# Patient Record
Sex: Female | Born: 1975 | Race: Black or African American | Hispanic: No | Marital: Single | Smoking: Former smoker
Health system: Southern US, Community
[De-identification: ages and names within clinical notes are randomized; demographics above are authoritative.]

## PROBLEM LIST (undated history)

## (undated) DIAGNOSIS — I10 Essential (primary) hypertension: Secondary | ICD-10-CM

## (undated) DIAGNOSIS — M1990 Unspecified osteoarthritis, unspecified site: Secondary | ICD-10-CM

## (undated) DIAGNOSIS — T7840XA Allergy, unspecified, initial encounter: Secondary | ICD-10-CM

## (undated) DIAGNOSIS — F32A Depression, unspecified: Secondary | ICD-10-CM

## (undated) DIAGNOSIS — R519 Headache, unspecified: Secondary | ICD-10-CM

## (undated) DIAGNOSIS — K219 Gastro-esophageal reflux disease without esophagitis: Secondary | ICD-10-CM

## (undated) DIAGNOSIS — D649 Anemia, unspecified: Secondary | ICD-10-CM

## (undated) DIAGNOSIS — R87619 Unspecified abnormal cytological findings in specimens from cervix uteri: Secondary | ICD-10-CM

## (undated) DIAGNOSIS — J45909 Unspecified asthma, uncomplicated: Secondary | ICD-10-CM

## (undated) DIAGNOSIS — F419 Anxiety disorder, unspecified: Secondary | ICD-10-CM

## (undated) HISTORY — DX: Headache, unspecified: R51.9

## (undated) HISTORY — DX: Depression, unspecified: F32.A

## (undated) HISTORY — PX: KNEE SURGERY: SHX244

## (undated) HISTORY — DX: Essential (primary) hypertension: I10

## (undated) HISTORY — DX: Unspecified abnormal cytological findings in specimens from cervix uteri: R87.619

## (undated) HISTORY — DX: Allergy, unspecified, initial encounter: T78.40XA

## (undated) HISTORY — DX: Anxiety disorder, unspecified: F41.9

## (undated) HISTORY — DX: Anemia, unspecified: D64.9

## (undated) HISTORY — PX: TUBAL LIGATION: SHX77

## (undated) HISTORY — DX: Unspecified osteoarthritis, unspecified site: M19.90

## (undated) HISTORY — PX: WISDOM TOOTH EXTRACTION: SHX21

---

## 2006-04-02 ENCOUNTER — Emergency Department: Payer: Self-pay | Admitting: Emergency Medicine

## 2006-11-04 ENCOUNTER — Emergency Department: Payer: Self-pay | Admitting: General Practice

## 2007-03-25 ENCOUNTER — Emergency Department: Payer: Self-pay | Admitting: Emergency Medicine

## 2007-06-12 ENCOUNTER — Emergency Department: Payer: Self-pay | Admitting: Emergency Medicine

## 2007-12-10 ENCOUNTER — Emergency Department: Payer: Self-pay | Admitting: Emergency Medicine

## 2007-12-10 ENCOUNTER — Other Ambulatory Visit: Payer: Self-pay

## 2011-02-01 ENCOUNTER — Emergency Department: Payer: Self-pay | Admitting: Internal Medicine

## 2011-03-19 ENCOUNTER — Emergency Department: Payer: Self-pay | Admitting: Emergency Medicine

## 2011-12-19 ENCOUNTER — Emergency Department: Payer: Self-pay | Admitting: Emergency Medicine

## 2011-12-19 LAB — URINALYSIS, COMPLETE
Bacteria: NONE SEEN
Bilirubin,UR: NEGATIVE
Glucose,UR: NEGATIVE mg/dL (ref 0–75)
Ph: 5 (ref 4.5–8.0)
RBC,UR: 9 /HPF (ref 0–5)
Specific Gravity: 1.026 (ref 1.003–1.030)
Squamous Epithelial: 1
WBC UR: 1 /HPF (ref 0–5)

## 2011-12-19 LAB — COMPREHENSIVE METABOLIC PANEL
Bilirubin,Total: 0.5 mg/dL (ref 0.2–1.0)
Calcium, Total: 8.9 mg/dL (ref 8.5–10.1)
Chloride: 109 mmol/L — ABNORMAL HIGH (ref 98–107)
Co2: 22 mmol/L (ref 21–32)
EGFR (African American): >60
EGFR (Non-African Amer.): >60
SGOT(AST): 17 U/L (ref 15–37)
SGPT (ALT): 20 U/L

## 2011-12-19 LAB — CBC
HCT: 36.7 % (ref 35.0–47.0)
MCH: 29.6 pg (ref 26.0–34.0)
MCHC: 32.3 g/dL (ref 32.0–36.0)
MCV: 92 fL (ref 80–100)
Platelet: 234 10*3/uL (ref 150–440)
RDW: 14.1 % (ref 11.5–14.5)

## 2011-12-19 LAB — LIPASE, BLOOD: Lipase: 103 U/L (ref 73–393)

## 2011-12-19 LAB — PREGNANCY, URINE: Pregnancy Test, Urine: NEGATIVE m[IU]/mL

## 2012-11-06 ENCOUNTER — Emergency Department: Payer: Self-pay | Admitting: Emergency Medicine

## 2012-11-06 LAB — COMPREHENSIVE METABOLIC PANEL
Albumin: 3.9 g/dL (ref 3.4–5.0)
Alkaline Phosphatase: 61 U/L (ref 50–136)
BUN: 11 mg/dL (ref 7–18)
Co2: 23 mmol/L (ref 21–32)
Creatinine: 0.69 mg/dL (ref 0.60–1.30)
EGFR (African American): >60
Potassium: 3.4 mmol/L — ABNORMAL LOW (ref 3.5–5.1)
SGPT (ALT): 19 U/L (ref 12–78)
Total Protein: 7.8 g/dL (ref 6.4–8.2)

## 2012-11-06 LAB — LIPASE, BLOOD: Lipase: 117 U/L (ref 73–393)

## 2012-11-06 LAB — URINALYSIS, COMPLETE
Blood: NEGATIVE
Glucose,UR: NEGATIVE mg/dL (ref 0–75)
Leukocyte Esterase: NEGATIVE
Nitrite: NEGATIVE
Ph: 5 (ref 4.5–8.0)
RBC,UR: 9 /HPF (ref 0–5)

## 2012-11-06 LAB — CBC WITH DIFFERENTIAL/PLATELET
Basophil %: 0.4 %
Eosinophil #: 0.2 10*3/uL (ref 0.0–0.7)
HCT: 39.6 % (ref 35.0–47.0)
HGB: 13.1 g/dL (ref 12.0–16.0)
Lymphocyte %: 10 %
MCH: 30.3 pg (ref 26.0–34.0)
Monocyte #: 0.6 x10 3/mm (ref 0.2–0.9)
Platelet: 267 10*3/uL (ref 150–440)
RBC: 4.33 10*6/uL (ref 3.80–5.20)
RDW: 13.8 % (ref 11.5–14.5)
WBC: 6.1 10*3/uL (ref 3.6–11.0)

## 2012-11-06 LAB — WET PREP, GENITAL

## 2012-11-06 LAB — PREGNANCY, URINE: Pregnancy Test, Urine: NEGATIVE m[IU]/mL

## 2012-11-18 ENCOUNTER — Ambulatory Visit: Payer: Self-pay | Admitting: Surgery

## 2012-11-19 ENCOUNTER — Other Ambulatory Visit: Payer: Self-pay | Admitting: Surgery

## 2012-11-19 LAB — COMPREHENSIVE METABOLIC PANEL
Alkaline Phosphatase: 47 U/L — ABNORMAL LOW (ref 50–136)
Bilirubin,Total: 0.3 mg/dL (ref 0.2–1.0)
Chloride: 106 mmol/L (ref 98–107)
Creatinine: 0.99 mg/dL (ref 0.60–1.30)
EGFR (African American): >60
EGFR (Non-African Amer.): >60
Glucose: 90 mg/dL (ref 65–99)
Osmolality: 274 (ref 275–301)
Potassium: 3.3 mmol/L — ABNORMAL LOW (ref 3.5–5.1)
SGOT(AST): 26 U/L (ref 15–37)
Total Protein: 7.3 g/dL (ref 6.4–8.2)

## 2012-11-19 LAB — CBC WITH DIFFERENTIAL/PLATELET
Basophil %: 1.3 %
Eosinophil #: 0 10*3/uL (ref 0.0–0.7)
HGB: 12.3 g/dL (ref 12.0–16.0)
Lymphocyte #: 1.5 10*3/uL (ref 1.0–3.6)
Lymphocyte %: 31.8 %
MCH: 29.8 pg (ref 26.0–34.0)
MCV: 91 fL (ref 80–100)
Monocyte #: 0.9 x10 3/mm (ref 0.2–0.9)
Monocyte %: 19.8 %
Neutrophil #: 2.2 10*3/uL (ref 1.4–6.5)
Neutrophil %: 46.5 %
RBC: 4.12 10*6/uL (ref 3.80–5.20)
RDW: 14.1 % (ref 11.5–14.5)
WBC: 4.8 10*3/uL (ref 3.6–11.0)

## 2012-11-19 LAB — LIPASE, BLOOD: Lipase: 232 U/L (ref 73–393)

## 2012-11-21 ENCOUNTER — Ambulatory Visit: Payer: Self-pay | Admitting: Surgery

## 2013-11-19 ENCOUNTER — Emergency Department: Payer: Self-pay | Admitting: Emergency Medicine

## 2015-03-14 ENCOUNTER — Encounter: Payer: Self-pay | Admitting: Emergency Medicine

## 2015-03-14 ENCOUNTER — Emergency Department
Admission: EM | Admit: 2015-03-14 | Discharge: 2015-03-14 | Disposition: A | Discharge status: Home / Self Care | Payer: Managed Care, Other (non HMO) | Attending: Emergency Medicine | Admitting: Emergency Medicine

## 2015-03-14 ENCOUNTER — Emergency Department: Payer: Managed Care, Other (non HMO)

## 2015-03-14 DIAGNOSIS — S46911A Strain of unspecified muscle, fascia and tendon at shoulder and upper arm level, right arm, initial encounter: Secondary | ICD-10-CM | POA: Diagnosis not present

## 2015-03-14 DIAGNOSIS — Y9289 Other specified places as the place of occurrence of the external cause: Secondary | ICD-10-CM | POA: Insufficient documentation

## 2015-03-14 DIAGNOSIS — Z87891 Personal history of nicotine dependence: Secondary | ICD-10-CM | POA: Insufficient documentation

## 2015-03-14 DIAGNOSIS — Y9389 Activity, other specified: Secondary | ICD-10-CM | POA: Diagnosis not present

## 2015-03-14 DIAGNOSIS — Y998 Other external cause status: Secondary | ICD-10-CM | POA: Diagnosis not present

## 2015-03-14 DIAGNOSIS — X58XXXA Exposure to other specified factors, initial encounter: Secondary | ICD-10-CM | POA: Diagnosis not present

## 2015-03-14 DIAGNOSIS — S4991XA Unspecified injury of right shoulder and upper arm, initial encounter: Secondary | ICD-10-CM | POA: Diagnosis present

## 2015-03-14 MED ORDER — DIAZEPAM 2 MG PO TABS: 2.0000 mg | ORAL_TABLET | Freq: Three times a day (TID) | ORAL | Status: DC | PRN

## 2015-03-14 NOTE — ED Notes (Signed)
Pt discharged home after verbalizing understanding of discharge instructions; nad noted. 

## 2015-03-14 NOTE — ED Notes (Signed)
Resumed care from [place]. Pt alert & oriented and resting.

## 2015-03-14 NOTE — ED Notes (Signed)
Pt reports that she developed right shoulder pain yesterday, she states that she injured in the gym on Saturday. No swelling or deformity seen.

## 2015-03-14 NOTE — Discharge Instructions (Signed)
Take ibuprofen, 6 in her milligrams 3 times a day, as discussed. He may also take Valium 2-4 mg as needed in the evening to help with some muscle relaxation. Use a sling to help rest the shoulder. If you do not improve over the next 4-5 days follow-up with orthopedics. Return to the emergency department if you have any urgent concerns.  Muscle Strain A muscle strain is an injury that occurs when a muscle is stretched beyond its normal length. Usually a small number of muscle fibers are torn when this happens. Muscle strain is rated in degrees. First-degree strains have the least amount of muscle fiber tearing and pain. Second-degree and third-degree strains have increasingly more tearing and pain.  Usually, recovery from muscle strain takes 1-2 weeks. Complete healing takes 5-6 weeks.  CAUSES  Muscle strain happens when a sudden, violent force placed on a muscle stretches it too far. This may occur with lifting, sports, or a fall.  RISK FACTORS Muscle strain is especially common in athletes.  SIGNS AND SYMPTOMS At the site of the muscle strain, there may be:  Pain.  Bruising.  Swelling.  Difficulty using the muscle due to pain or lack of normal function. DIAGNOSIS  Your health care provider will perform a physical exam and ask about your medical history. TREATMENT  Often, the best treatment for a muscle strain is resting, icing, and applying cold compresses to the injured area.  HOME CARE INSTRUCTIONS   Use the PRICE method of treatment to promote muscle healing during the first 2-3 days after your injury. The PRICE method involves:  Protecting the muscle from being injured again.  Restricting your activity and resting the injured body part.  Icing your injury. To do this, put ice in a plastic bag. Place a towel between your skin and the bag. Then, apply the ice and leave it on from 15-20 minutes each hour. After the third day, switch to moist heat packs.  Apply compression to the  injured area with a splint or elastic bandage. Be careful not to wrap it too tightly. This may interfere with blood circulation or increase swelling.  Elevate the injured body part above the level of your heart as often as you can.  Only take over-the-counter or prescription medicines for pain, discomfort, or fever as directed by your health care provider.  Warming up prior to exercise helps to prevent future muscle strains. SEEK MEDICAL CARE IF:   You have increasing pain or swelling in the injured area.  You have numbness, tingling, or a significant loss of strength in the injured area. MAKE SURE YOU:   Understand these instructions.  Will watch your condition.  Will get help right away if you are not doing well or get worse. Document Released: 10/01/2005 Document Revised: 07/22/2013 Document Reviewed: 04/30/2013 [facility] Patient Information 2015 [employer], [place]. This information is not intended to replace advice given to you by your health care provider. Make sure you discuss any questions you have with your health care provider.

## 2015-03-14 NOTE — ED Notes (Signed)
Was lifting weights Saturday, pain with movement R shoulder began Sunday am.

## 2015-03-14 NOTE — ED Provider Notes (Signed)
[facility] Emergency Department Provider Note  ____________________________________________  Time seen: 11:15 AM  I have reviewed the triage vital signs and the nursing notes.   HISTORY  Chief Complaint Shoulder Pain     HPI Cassandra Cardenas is a 39 y.o. female who was working out in the gym on Saturday. She changed upper teens some but it is not completely new to her. Sunday morning she woke with a great feel of discomfort and tightness in her right shoulder. This continues despite ibuprofen and some Tylenol. She has been going to work and trying to do her work related tasks, but this is difficult with the pain and soreness. She denies any acute trauma area and there is no pop or snap or single event that caused pain for her. She does have some mild paresthesias that go down the right arm.    History reviewed. No pertinent past medical history.  There are no active problems to display for this patient.   History reviewed. No pertinent past surgical history.  Current Outpatient Rx  Name  Route  Sig  Dispense  Refill  . diazepam (VALIUM) 2 MG tablet   Oral   Take 1 tablet (2 mg total) by mouth every 8 (eight) hours as needed for anxiety or muscle spasms.   12 tablet   0     Allergies Latex  No family history on file.  Social History History  Substance Use Topics  . Smoking status: Former Games developermoker  . Smokeless tobacco: Not on file  . Alcohol Use: No    Review of Systems  Constitutional: Negative for fever. ENT: Negative for sore throat. Cardiovascular: Negative for chest pain. Respiratory: Negative for shortness of breath. Gastrointestinal: Negative for abdominal pain, vomiting and diarrhea. Genitourinary: Negative for dysuria. Musculoskeletal: Positive for right shoulder pain Skin: Negative for rash. Neurological: Negative for headaches   10-point ROS otherwise  negative.  ____________________________________________   PHYSICAL EXAM:  VITAL SIGNS: ED Triage Vitals  Enc Vitals Group     BP 03/14/15 0957 119/61 mmHg     Pulse Rate 03/14/15 0957 74     Resp 03/14/15 0957 18     Temp 03/14/15 0957 97.7 F (36.5 C)     Temp Source 03/14/15 0957 Oral     SpO2 03/14/15 0957 100 %     Weight 03/14/15 0957 151 lb (68.493 kg)     Height 03/14/15 0957 5\' 5"  (1.651 m)     Head Cir --      Peak Flow --      Pain Score 03/14/15 0957 8     Pain Loc --      Pain Edu? --      Excl. in GC? --     Constitutional:  Alert and oriented. Well appearing and in no distress. Neck: Nontender no muscle spasm. Cardiovascular: Normal rate, regular rhythm. Respiratory: Normal respiratory effort without tachypnea. Breath sounds are clear and equal bilaterally. No wheezes/rales/rhonchi. Gastrointestinal: Soft and nontender. No distention.  Back: Mild tenderness in the right upper back right trapezius. Otherwise negative Musculoskeletal: No instability no pain in the right clavicle. There is some mild point tenderness over the before meals joint of the right shoulder. The deltoid itself is quite tender and slightly firm. Her range of motion is decreased due to pain in the shoulder but she is able to move it through a fairly good amount of range area she has an intact pulse and good motion in her  hand and wrist. Neurologic:  Normal speech and language. No gross focal neurologic deficits are appreciated.  Skin:  Skin is warm, dry. No rash noted. Psychiatric: Mood and affect are normal. Speech and behavior are normal.  ____________________________________________    RADIOLOGY  Right shoulder x-ray: No acute abnormality noted.  ____________________________________________  INITIAL IMPRESSION / ASSESSMENT AND PLAN / ED COURSE  Patient with right shoulder strain. Treat with ibuprofen. I will prescribe Valium 2 mg to assist with some muscle relaxation. I advised her  to take this only in the evening or before bed and not drink any work time activity.  ____________________________________________   FINAL CLINICAL IMPRESSION(S) / ED DIAGNOSES  Final diagnoses:  Right shoulder strain, initial encounter      [name], MD 03/14/15 1134

## 2015-06-20 ENCOUNTER — Emergency Department: Payer: Managed Care, Other (non HMO)

## 2015-06-20 ENCOUNTER — Encounter: Payer: Self-pay | Admitting: Medical Oncology

## 2015-06-20 ENCOUNTER — Emergency Department
Admission: EM | Admit: 2015-06-20 | Discharge: 2015-06-20 | Disposition: A | Discharge status: Home / Self Care | Payer: Managed Care, Other (non HMO) | Attending: Emergency Medicine | Admitting: Emergency Medicine

## 2015-06-20 DIAGNOSIS — R112 Nausea with vomiting, unspecified: Secondary | ICD-10-CM | POA: Diagnosis not present

## 2015-06-20 DIAGNOSIS — Z3202 Encounter for pregnancy test, result negative: Secondary | ICD-10-CM | POA: Insufficient documentation

## 2015-06-20 DIAGNOSIS — Z87891 Personal history of nicotine dependence: Secondary | ICD-10-CM | POA: Insufficient documentation

## 2015-06-20 DIAGNOSIS — R319 Hematuria, unspecified: Secondary | ICD-10-CM

## 2015-06-20 DIAGNOSIS — R109 Unspecified abdominal pain: Secondary | ICD-10-CM | POA: Diagnosis present

## 2015-06-20 DIAGNOSIS — R1084 Generalized abdominal pain: Secondary | ICD-10-CM | POA: Insufficient documentation

## 2015-06-20 DIAGNOSIS — Z91040 Latex allergy status: Secondary | ICD-10-CM | POA: Diagnosis not present

## 2015-06-20 LAB — COMPREHENSIVE METABOLIC PANEL
ALBUMIN: 4.4 g/dL (ref 3.5–5.0)
ALT: 15 U/L (ref 14–54)
ANION GAP: 9 (ref 5–15)
AST: 22 U/L (ref 15–41)
Alkaline Phosphatase: 38 U/L (ref 38–126)
BILIRUBIN TOTAL: 1.1 mg/dL (ref 0.3–1.2)
BUN: 12 mg/dL (ref 6–20)
CO2: 21 mmol/L — ABNORMAL LOW (ref 22–32)
Calcium: 9.1 mg/dL (ref 8.9–10.3)
Chloride: 107 mmol/L (ref 101–111)
Creatinine, Ser: 0.81 mg/dL (ref 0.44–1.00)
GFR calc non Af Amer: >60 mL/min (ref >60)
GLUCOSE: 82 mg/dL (ref 65–99)
POTASSIUM: 3.7 mmol/L (ref 3.5–5.1)
Sodium: 137 mmol/L (ref 135–145)
TOTAL PROTEIN: 7.3 g/dL (ref 6.5–8.1)

## 2015-06-20 LAB — URINALYSIS COMPLETE WITH MICROSCOPIC (ARMC ONLY)
BILIRUBIN URINE: NEGATIVE
Bacteria, UA: NONE SEEN
Glucose, UA: NEGATIVE mg/dL
HGB URINE DIPSTICK: NEGATIVE
LEUKOCYTES UA: NEGATIVE
NITRITE: NEGATIVE
PH: 8 (ref 5.0–8.0)
PROTEIN: NEGATIVE mg/dL
SPECIFIC GRAVITY, URINE: 1.018 (ref 1.005–1.030)

## 2015-06-20 LAB — CHLAMYDIA/NGC RT PCR (ARMC ONLY)
Chlamydia Tr: NOT DETECTED
N gonorrhoeae: NOT DETECTED

## 2015-06-20 LAB — WET PREP, GENITAL
CLUE CELLS WET PREP: NONE SEEN
TRICH WET PREP: NONE SEEN
Yeast Wet Prep HPF POC: NONE SEEN

## 2015-06-20 LAB — CBC
HCT: 38 % (ref 35.0–47.0)
HEMOGLOBIN: 12.7 g/dL (ref 12.0–16.0)
MCH: 30.8 pg (ref 26.0–34.0)
MCHC: 33.3 g/dL (ref 32.0–36.0)
MCV: 92.7 fL (ref 80.0–100.0)
Platelets: 240 10*3/uL (ref 150–440)
RBC: 4.1 MIL/uL (ref 3.80–5.20)
RDW: 12.8 % (ref 11.5–14.5)
WBC: 9.1 10*3/uL (ref 3.6–11.0)

## 2015-06-20 LAB — PREGNANCY, URINE: Preg Test, Ur: NEGATIVE

## 2015-06-20 LAB — LIPASE, BLOOD: LIPASE: 20 U/L — AB (ref 22–51)

## 2015-06-20 MED ORDER — ONDANSETRON HCL 4 MG/2ML IJ SOLN
4.0000 mg | Freq: Once | INTRAMUSCULAR | Status: AC
  Administered 2015-06-20 (×2): 4 mg via INTRAVENOUS

## 2015-06-20 MED ORDER — KETOROLAC TROMETHAMINE 10 MG PO TABS: 10.0000 mg | ORAL_TABLET | Freq: Three times a day (TID) | ORAL | Status: DC | PRN

## 2015-06-20 MED ORDER — IOHEXOL 300 MG/ML  SOLN
100.0000 mL | Freq: Once | INTRAMUSCULAR | Status: AC | PRN
  Administered 2015-06-20: 100 mL via INTRAVENOUS
  Filled 2015-06-20: qty 100

## 2015-06-20 MED ORDER — MORPHINE SULFATE (PF) 4 MG/ML IV SOLN
4.0000 mg | Freq: Once | INTRAVENOUS | Status: AC
  Administered 2015-06-20: 4 mg via INTRAVENOUS
  Filled 2015-06-20: qty 1

## 2015-06-20 MED ORDER — HYDROCODONE-ACETAMINOPHEN 5-325 MG PO TABS: 1.0000 | ORAL_TABLET | Freq: Four times a day (QID) | ORAL | Status: DC | PRN

## 2015-06-20 MED ORDER — IOHEXOL 240 MG/ML SOLN
25.0000 mL | Freq: Once | INTRAMUSCULAR | Status: AC | PRN
  Administered 2015-06-20: 25 mL via ORAL
  Filled 2015-06-20: qty 25

## 2015-06-20 MED ORDER — ONDANSETRON HCL 4 MG/2ML IJ SOLN
4.0000 mg | Freq: Once | INTRAMUSCULAR | Status: AC
  Administered 2015-06-20: 4 mg via INTRAVENOUS
  Filled 2015-06-20: qty 2

## 2015-06-20 MED ORDER — ONDANSETRON HCL 4 MG PO TABS: 4.0000 mg | ORAL_TABLET | Freq: Three times a day (TID) | ORAL | Status: DC | PRN

## 2015-06-20 MED ORDER — ONDANSETRON HCL 4 MG/2ML IJ SOLN
INTRAMUSCULAR | Status: AC
  Administered 2015-06-20: 4 mg via INTRAVENOUS
  Filled 2015-06-20: qty 2

## 2015-06-20 MED ORDER — TAMSULOSIN HCL 0.4 MG PO CAPS: 0.4000 mg | ORAL_CAPSULE | Freq: Every day | ORAL | Status: DC

## 2015-06-20 MED ORDER — SODIUM CHLORIDE 0.9 % IV BOLUS (SEPSIS)
1000.0000 mL | Freq: Once | INTRAVENOUS | Status: AC
  Administered 2015-06-20: 1000 mL via INTRAVENOUS

## 2015-06-20 NOTE — ED Notes (Signed)
Pt to triage with reports that she began having vomiting this am and then she started having left lower abd pain with radiation of pain into rt flank. Denies dysuria.

## 2015-06-20 NOTE — ED Notes (Signed)
POCT is Negative

## 2015-06-20 NOTE — ED Provider Notes (Addendum)
[facility] Emergency Department Provider Note   ____________________________________________  Time seen: 6:30 PM I have reviewed the triage vital signs and the triage nursing note.  HISTORY  Chief Complaint Emesis; Abdominal Pain; and Flank Pain   Historian Patient  HPI Cassandra Cardenas is a 39 y.o. female who wrote this morning feeling nausea and then vomited twice. She then started having left-sided abdominal pain which then moved over into the right side abdomen and into the right flank. No fever. Pain is moderate to severe. No pelvic pain. Does not feel like her prior ovarian cyst. No pelvic pain. No dysuria. No fever. No bad food. No travel history significant.    History reviewed. No pertinent past medical history. ovarian cyst  There are no active problems to display for this patient.   Past Surgical History  Procedure Laterality Date  . Knee surgery      Current Outpatient Rx  Name  Route  Sig  Dispense  Refill  . diazepam (VALIUM) 2 MG tablet   Oral   Take 1 tablet (2 mg total) by mouth every 8 (eight) hours as needed for anxiety or muscle spasms.   12 tablet   0     Allergies Latex  No family history on file.  Social History Social History  Substance Use Topics  . Smoking status: Former Games developer  . Smokeless tobacco: None  . Alcohol Use: Yes     Comment: occ    Review of Systems  Constitutional: Negative for fever. Eyes: Negative for visual changes. ENT: Negative for sore throat. Cardiovascular: Negative for chest pain. Respiratory: Negative for shortness of breath. Gastrointestinal: Negative for diarrhea. Genitourinary: Negative for dysuria. Musculoskeletal: Positive for right flank pain. Skin: Negative for rash. Neurological: Negative for headache. 10 point Review of Systems otherwise negative ____________________________________________   PHYSICAL EXAM:  VITAL SIGNS: ED Triage Vitals  Enc Vitals Group      BP 06/20/15 1605 122/74 mmHg     Pulse Rate 06/20/15 1605 69     Resp 06/20/15 1605 18     Temp 06/20/15 1605 98.3 F (36.8 C)     Temp Source 06/20/15 1605 Oral     SpO2 06/20/15 1605 100 %     Weight 06/20/15 1605 153 lb (69.4 kg)     Height 06/20/15 1605 5\' 5"  (1.651 m)     Head Cir --      Peak Flow --      Pain Score 06/20/15 1605 10     Pain Loc --      Pain Edu? --      Excl. in GC? --      Constitutional: Alert and oriented. Well appearing and overall but called up her side due to pain in the right side abdomen.  Eyes: Conjunctivae are normal. PERRL. Normal extraocular movements. ENT   Head: Normocephalic and atraumatic.   Nose: No congestion/rhinnorhea.   Mouth/Throat: Mucous membranes are moist.   Neck: No stridor. Cardiovascular/Chest: Normal rate, regular rhythm.  No murmurs, rubs, or gallops. Respiratory: Normal respiratory effort without tachypnea nor retractions. Breath sounds are clear and equal bilaterally. No wheezes/rales/rhonchi. Gastrointestinal: Soft. No distention. Moderate tenderness in the right abdomen with moderate guarding. Mild tenderness diffusely.  Genitourinary/rectal: Small amount of vaginal discharge, white. No vaginal bleeding. No cervicitis or pelvic mass or adnexal tenderness. Musculoskeletal: Nontender with normal range of motion in all extremities. No joint effusions.  No lower extremity tenderness.  No edema. Neurologic:  Normal  speech and language. No gross or focal neurologic deficits are appreciated. Skin:  Skin is warm, dry and intact. No rash noted. Psychiatric: Mood and affect are normal. Speech and behavior are normal. Patient exhibits appropriate insight and judgment.  ____________________________________________   EKG I, Governor Rooks, MD, the attending physician have personally viewed and interpreted all ECGs.  No EKG performed ____________________________________________  LABS (pertinent  positives/negatives)  Comments a metabolic panel without significant abnormality CBC within normal limits Lipase 20 Urinalysis: 6-30 red blood cells. No signs of urinary tract infection. 2+ ketones Wet prep: few wbc's and otherwise negative ____________________________________________  RADIOLOGY All Xrays were viewed by me. Imaging interpreted by Radiologist.  CT abdomen and pelvis with contrast:  IMPRESSION: 1. No explanation for abdominal pain or RIGHT flank pain. 2. Nonobstructing RIGHT renal calculus. No ureterolithiasis. 3. Normal appendix __________________________________________  PROCEDURES  Procedure(s) performed: None  Critical Care performed: None  ____________________________________________   ED COURSE / ASSESSMENT AND PLAN  CONSULTATIONS: None  Pertinent labs & imaging results that were available during my care of the patient were reviewed by me and considered in my medical decision making (see chart for details).  Patient's symptoms of generalized and both sided abdominal pain along with vomiting maybe initially clinically suspicious of likely viral/gastritis. However on exam she was more exquisitely tender than I suspected especially on the right side. It is not focally over the right upper quadrant or deep into the pelvis, it's more in the mid abdomen, however slightly difficult to localize. Patient is up to this point unable to urinate, and so I started her on a bolus of IV fluids, morphine, and Zofran. Laboratory evaluation is reassuring. She is notably white blood cell count. She has no elevated LFTs or bilirubin. Her electrolytes are normal.  No cervicitis on exam. Patient is not pelvic, and I do not suspect ovarian source of pain.  Due to patient's persistent severe tenderness on palpation, I discussed with her obtaining a CT abdomen and pelvis and we chose to proceed.  Urinalysis was finally obtained, was positive for red blood cells raising concern  for possible kidney stone.  ----------------------------------------- 10:49 PM on 06/20/2015 -----------------------------------------  CT scan reassuring. Patient is feeling much improved. She states she just has a small amount of pressure in her right flank. It is possible that she may have a tiny kidney stone given the hematuria and referral pain into the right flank.  I discussed with the patient that her imaging and laboratory evaluation are reassuring, as is her improvement in her clinical symptoms. Clinically I do not think she is having a biliary emergency, we did have return precautions discussion with regard to any worsening pain, fever, dehydration, and if symptoms concerning to her.  Patient / Family / Caregiver informed of clinical course, medical decision-making process, and agree with plan.   I discussed return precautions, follow-up instructions, and discharged instructions with patient and/or family.  ___________________________________________   FINAL CLINICAL IMPRESSION(S) / ED DIAGNOSES   Final diagnoses:  Abdominal pain, generalized  Non-intractable vomiting with nausea, vomiting of unspecified type       Governor Rooks, MD 06/20/15 2250  Governor Rooks, MD 06/20/15 2255

## 2015-06-20 NOTE — Discharge Instructions (Signed)
You were evaluated for right-sided abdominal pain into the right flank, and your exam and evaluation are reassuring. Your urine showed some blood in it, and as we discussed, I suspect you may have a small kidney stone that was not seen on CT scan with contrast. Return to the emergency room for any worsening pain, vomiting, black or bloody stool, fever, or any other symptoms concerning to you.    Kidney Stones Kidney stones (urolithiasis) are deposits that form inside your kidneys. The intense pain is caused by the stone moving through the urinary tract. When the stone moves, the ureter goes into spasm around the stone. The stone is usually passed in the urine.  CAUSES   A disorder that makes certain neck glands produce too much parathyroid hormone (primary hyperparathyroidism).  A buildup of uric acid crystals, similar to gout in your joints.  Narrowing (stricture) of the ureter.  A kidney obstruction present at birth (congenital obstruction).  Previous surgery on the kidney or ureters.  Numerous kidney infections. SYMPTOMS   Feeling sick to your stomach (nauseous).  Throwing up (vomiting).  Blood in the urine (hematuria).  Pain that usually spreads (radiates) to the groin.  Frequency or urgency of urination. DIAGNOSIS   Taking a history and physical exam.  Blood or urine tests.  CT scan.  Occasionally, an examination of the inside of the urinary bladder (cystoscopy) is performed. TREATMENT   Observation.  Increasing your fluid intake.  Extracorporeal shock wave lithotripsy--This is a noninvasive procedure that uses shock waves to break up kidney stones.  Surgery may be needed if you have severe pain or persistent obstruction. There are various surgical procedures. Most of the procedures are performed with the use of small instruments. Only small incisions are needed to accommodate these instruments, so recovery time is minimized. The size, location, and chemical  composition are all important variables that will determine the proper choice of action for you. Talk to your health care provider to better understand your situation so that you will minimize the risk of injury to yourself and your kidney.  HOME CARE INSTRUCTIONS   Drink enough water and fluids to keep your urine clear or pale yellow. This will help you to pass the stone or stone fragments.  Strain all urine through the provided strainer. Keep all particulate matter and stones for your health care provider to see. The stone causing the pain may be as small as a grain of salt. It is very important to use the strainer each and every time you pass your urine. The collection of your stone will allow your health care provider to analyze it and verify that a stone has actually passed. The stone analysis will often identify what you can do to reduce the incidence of recurrences.  Only take over-the-counter or prescription medicines for pain, discomfort, or fever as directed by your health care provider.  Make a follow-up appointment with your health care provider as directed.  Get follow-up X-rays if required. The absence of pain does not always mean that the stone has passed. It may have only stopped moving. If the urine remains completely obstructed, it can cause loss of kidney function or even complete destruction of the kidney. It is your responsibility to make sure X-rays and follow-ups are completed. Ultrasounds of the kidney can show blockages and the status of the kidney. Ultrasounds are not associated with any radiation and can be performed easily in a matter of minutes. SEEK MEDICAL CARE IF:  You  experience pain that is progressive and unresponsive to any pain medicine you have been prescribed. SEEK IMMEDIATE MEDICAL CARE IF:   Pain cannot be controlled with the prescribed medicine.  You have a fever or shaking chills.  The severity or intensity of pain increases over 18 hours and is not  relieved by pain medicine.  You develop a new onset of abdominal pain.  You feel faint or pass out.  You are unable to urinate. MAKE SURE YOU:   Understand these instructions.  Will watch your condition.  Will get help right away if you are not doing well or get worse. Document Released: 10/01/2005 Document Revised: 06/03/2013 Document Reviewed: 03/04/2013 [facility] Patient Information 2015 [place], [place]. This information is not intended to replace advice given to you by your health care provider. Make sure you discuss any questions you have with your health care provider.  Abdominal Pain Many things can cause abdominal pain. Usually, abdominal pain is not caused by a disease and will improve without treatment. It can often be observed and treated at home. Your health care provider will do a physical exam and possibly order blood tests and X-rays to help determine the seriousness of your pain. However, in many cases, more time must pass before a clear cause of the pain can be found. Before that point, your health care provider may not know if you need more testing or further treatment. HOME CARE INSTRUCTIONS  Monitor your abdominal pain for any changes. The following actions may help to alleviate any discomfort you are experiencing:  Only take over-the-counter or prescription medicines as directed by your health care provider.  Do not take laxatives unless directed to do so by your health care provider.  Try a clear liquid diet (broth, tea, or water) as directed by your health care provider. Slowly move to a bland diet as tolerated. SEEK MEDICAL CARE IF:  You have unexplained abdominal pain.  You have abdominal pain associated with nausea or diarrhea.  You have pain when you urinate or have a bowel movement.  You experience abdominal pain that wakes you in the night.  You have abdominal pain that is worsened or improved by eating food.  You have abdominal pain that is worsened  with eating fatty foods.  You have a fever. SEEK IMMEDIATE MEDICAL CARE IF:   Your pain does not go away within 2 hours.  You keep throwing up (vomiting).  Your pain is felt only in portions of the abdomen, such as the right side or the left lower portion of the abdomen.  You pass bloody or black tarry stools. MAKE SURE YOU:  Understand these instructions.   Will watch your condition.   Will get help right away if you are not doing well or get worse.  Document Released: 07/11/2005 Document Revised: 10/06/2013 Document Reviewed: 06/10/2013 [facility] Patient Information 2015 [place], [place]. This information is not intended to replace advice given to you by your health care provider. Make sure you discuss any questions you have with your health care provider.   Hematuria Hematuria is blood in your urine. It can be caused by a bladder infection, kidney infection, prostate infection, kidney stone, or cancer of your urinary tract. Infections can usually be treated with medicine, and a kidney stone usually will pass through your urine. If neither of these is the cause of your hematuria, further workup to find out the reason may be needed. It is very important that you tell your health care provider about  any blood you see in your urine, even if the blood stops without treatment or happens without causing pain. Blood in your urine that happens and then stops and then happens again can be a symptom of a very serious condition. Also, pain is not a symptom in the initial stages of many urinary cancers. HOME CARE INSTRUCTIONS   Drink lots of fluid, 3-4 quarts a day. If you have been diagnosed with an infection, cranberry juice is especially recommended, in addition to large amounts of water.  Avoid caffeine, tea, and carbonated beverages because they tend to irritate the bladder.  Avoid alcohol because it may irritate the prostate.  Take all medicines as directed by your health care  provider.  If you were prescribed an antibiotic medicine, finish it all even if you start to feel better.  If you have been diagnosed with a kidney stone, follow your health care provider's instructions regarding straining your urine to catch the stone.  Empty your bladder often. Avoid holding urine for long periods of time.  After a bowel movement, women should cleanse front to back. Use each tissue only once.  Empty your bladder before and after sexual intercourse if you are a female. SEEK MEDICAL CARE IF:  You develop back pain.  You have a fever.  You have a feeling of sickness in your stomach (nausea) or vomiting.  Your symptoms are not better in 3 days. Return sooner if you are getting worse. SEEK IMMEDIATE MEDICAL CARE IF:   You develop severe vomiting and are unable to keep the medicine down.  You develop severe back or abdominal pain despite taking your medicines.  You begin passing a large amount of blood or clots in your urine.  You feel extremely weak or faint, or you pass out. MAKE SURE YOU:   Understand these instructions.  Will watch your condition.  Will get help right away if you are not doing well or get worse. Document Released: 10/01/2005 Document Revised: 02/15/2014 Document Reviewed: 06/01/2013 [facility] Patient Information 2015 [place], [place]. This information is not intended to replace advice given to you by your health care provider. Make sure you discuss any questions you have with your health care provider.

## 2015-06-20 NOTE — ED Notes (Signed)
PO contrast complete -- CT notified.

## 2015-06-20 NOTE — ED Notes (Signed)
Pt transported to and from CT via stretcher without incident. 

## 2015-06-22 LAB — POCT PREGNANCY, URINE: Preg Test, Ur: NEGATIVE

## 2015-11-06 ENCOUNTER — Encounter: Payer: Self-pay | Admitting: Emergency Medicine

## 2015-11-06 ENCOUNTER — Emergency Department
Admission: EM | Admit: 2015-11-06 | Discharge: 2015-11-06 | Disposition: A | Discharge status: Home / Self Care | Payer: Managed Care, Other (non HMO) | Attending: Emergency Medicine | Admitting: Emergency Medicine

## 2015-11-06 ENCOUNTER — Emergency Department
Admission: EM | Admit: 2015-11-06 | Discharge: 2015-11-06 | Disposition: A | Discharge status: Home / Self Care | Payer: Managed Care, Other (non HMO) | Source: Home / Self Care | Attending: Emergency Medicine | Admitting: Emergency Medicine

## 2015-11-06 DIAGNOSIS — L509 Urticaria, unspecified: Secondary | ICD-10-CM | POA: Insufficient documentation

## 2015-11-06 DIAGNOSIS — Z79899 Other long term (current) drug therapy: Secondary | ICD-10-CM

## 2015-11-06 DIAGNOSIS — L299 Pruritus, unspecified: Secondary | ICD-10-CM

## 2015-11-06 DIAGNOSIS — R21 Rash and other nonspecific skin eruption: Secondary | ICD-10-CM | POA: Diagnosis present

## 2015-11-06 DIAGNOSIS — Z91040 Latex allergy status: Secondary | ICD-10-CM

## 2015-11-06 DIAGNOSIS — Z87891 Personal history of nicotine dependence: Secondary | ICD-10-CM

## 2015-11-06 MED ORDER — RANITIDINE HCL 150 MG PO TABS: 150.0000 mg | ORAL_TABLET | Freq: Two times a day (BID) | ORAL | Status: DC

## 2015-11-06 MED ORDER — PREDNISONE 10 MG PO TABS: ORAL_TABLET | ORAL | Status: DC

## 2015-11-06 MED ORDER — FAMOTIDINE 20 MG PO TABS
20.0000 mg | ORAL_TABLET | Freq: Once | ORAL | Status: AC
  Administered 2015-11-06: 20 mg via ORAL
  Filled 2015-11-06: qty 1

## 2015-11-06 MED ORDER — DIPHENHYDRAMINE HCL 25 MG PO CAPS
50.0000 mg | ORAL_CAPSULE | Freq: Once | ORAL | Status: AC
  Administered 2015-11-06: 50 mg via ORAL

## 2015-11-06 MED ORDER — DIPHENHYDRAMINE HCL 25 MG PO CAPS
ORAL_CAPSULE | ORAL | Status: DC
Start: 2015-11-06 — End: 2015-11-07
  Filled 2015-11-06: qty 2

## 2015-11-06 MED ORDER — DEXAMETHASONE SODIUM PHOSPHATE 10 MG/ML IJ SOLN
10.0000 mg | Freq: Once | INTRAMUSCULAR | Status: AC
  Administered 2015-11-06: 10 mg via INTRAMUSCULAR
  Filled 2015-11-06: qty 1

## 2015-11-06 NOTE — ED Notes (Signed)
Rash on and off since fri

## 2015-11-06 NOTE — ED Notes (Signed)
Pt states that she was seen last night for hives, pt started prednisone an hour ago then around her mouth started to itch and burn, pt states that she cont to have itching and burning. No swelling noted, no difficulty breathing

## 2015-11-06 NOTE — ED Provider Notes (Signed)
[facility] Emergency Department Provider Note  ____________________________________________  Time seen: 9:45 PM  I have reviewed the triage vital signs and the nursing notes.  History by:  Patient  HISTORY  Chief Complaint Pruritis     HPI Cassandra Cardenas is a 40 y.o. female who is reporting she is having some itching and burning sensation in her face. She was seen earlier today in the emergency department for similar, but in other parts of her body. She had urticaria on both arms and legs and some on her body. She was treated with Decadron, Pepcid, and discharged to take Benadryl at home. The patient 25 mg earlier today. She filled her prescriptions and took 60 mg of prednisone at approximately 9 PM.  She denies any difficulty with her throat. She is swallowing without any difficulty. She denies any shortness of breath. She denies any swelling to her lips or tongue.  Much of the urticaria noted earlier has improved. She still has a bit of a patch on the left shoulder.   No past medical history on file.  There are no active problems to display for this patient.   Past Surgical History  Procedure Laterality Date  . Knee surgery      Current Outpatient Rx  Name  Route  Sig  Dispense  Refill  . predniSONE (DELTASONE) 10 MG tablet      Take 6 tablets  today, on day 2 take 5 tablets, day 3 take 4 tablets, day 4 take 3 tablets, day 5 take  2 tablets and 1 tablet the last day   21 tablet   0   . ranitidine (ZANTAC) 150 MG tablet   Oral   Take 1 tablet (150 mg total) by mouth 2 (two) times daily.   30 tablet   1     Allergies Latex  No family history on file.  Social History Social History  Substance Use Topics  . Smoking status: Former Games developer  . Smokeless tobacco: Not on file  . Alcohol Use: Yes     Comment: occ    Review of Systems  Constitutional: Negative for fever/chills. ENT: Negative for congestion. No sore throat, no  difficulty swallowing. Cardiovascular: Negative for chest pain. Respiratory: Negative for cough. No shortness of breath. Gastrointestinal: Negative for abdominal pain, vomiting and diarrhea. Genitourinary: Negative for dysuria. Musculoskeletal: No back pain. Skin: Notable for urticaria earlier today with some lingering now. See history of present illness Neurological: Negative for headache or focal weakness   10-point ROS otherwise negative.  ____________________________________________   PHYSICAL EXAM:  VITAL SIGNS: ED Triage Vitals  Enc Vitals Group     BP 11/06/15 2129 143/87 mmHg     Pulse Rate 11/06/15 2129 99     Resp 11/06/15 2129 18     Temp 11/06/15 2129 98.3 F (36.8 C)     Temp Source 11/06/15 2129 Oral     SpO2 11/06/15 2129 100 %     Weight 11/06/15 2129 162 lb (73.483 kg)     Height 11/06/15 2129  (1.651 m)     Head Cir --      Peak Flow --      Pain Score --      Pain Loc --      Pain Edu? --      Excl. in GC? --     Constitutional: Alert and oriented. Overall well-appearing no acute distress, but seems a little bit frustrated with the discomfort  of itching on her face. ENT   Head: Normocephalic and atraumatic.   Nose: No congestion/rhinnorhea.       Mouth: No erythema, no swelling   Cardiovascular: Normal rate, regular rhythm, no murmur noted Respiratory:  Normal respiratory effort, no tachypnea.    Breath sounds are clear and equal bilaterally.  Gastrointestinal: Soft, no distention. Nontender Back: No muscle spasm, no tenderness, no CVA tenderness. Musculoskeletal: No deformity noted. Nontender with normal range of motion in all extremities.  No noted edema. Neurologic:  Communicative. Normal appearing spontaneous movement in all 4 extremities. No gross focal neurologic deficits are appreciated.  Skin:  There is still some urticaria noted on the posterior left shoulder. There is no erythema or urticaria on the face. Psychiatric: Mood and  affect are normal. Speech and behavior are normal.  ____________________________________________    ____________________________________________   INITIAL IMPRESSION / ASSESSMENT AND PLAN / ED COURSE  Pertinent labs & imaging results that were available during my care of the patient were reviewed by me and considered in my medical decision making (see chart for details).  Alert, communicative, 40 year old female with no respiratory problems or throat problems with some discomfort, burning, itching, on her face. She has been having urticaria recently was seen earlier in the emergency department. We will give her 50 mg of Benadryl by mouth now and encouraged her to take one of the ranitidine tablets, 150 mg, that was prescribed earlier that she hasn't her position. Her mother is driving so she is able to take Benadryl this time.  ____________________________________________   FINAL CLINICAL IMPRESSION(S) / ED DIAGNOSES  Final diagnoses:  Urticaria  Itching   on face    Cassandra Ramus, MD 11/06/15 2201

## 2015-11-06 NOTE — ED Provider Notes (Signed)
[facility] Emergency Department Provider Note ____________________________________________  Time seen: Approximately 11:57 AM  I have reviewed the triage vital signs and the nursing notes.   HISTORY  Chief Complaint Rash  HPI Cassandra Cardenas is a 40 y.o. female is here complaining of rash off and on since Friday. The last 2 days patient has been taking Benadryl with some minimal relief. Patient is unaware of any allergies and has never experienced anything like this. She denies any difficulty with breathing or swallowing. She is not aware of any new foods or clothing. She denies any history of asthma. Patient states that when she is not taking Benadryl that these areas rise, itches severely, then began to fade away.He rates her discomfort as a 6 out of 10.   History reviewed. No pertinent past medical history.  There are no active problems to display for this patient.   Past Surgical History  Procedure Laterality Date  . Knee surgery      Current Outpatient Rx  Name  Route  Sig  Dispense  Refill  . predniSONE (DELTASONE) 10 MG tablet      Take 6 tablets  today, on day 2 take 5 tablets, day 3 take 4 tablets, day 4 take 3 tablets, day 5 take  2 tablets and 1 tablet the last day   21 tablet   0   . ranitidine (ZANTAC) 150 MG tablet   Oral   Take 1 tablet (150 mg total) by mouth 2 (two) times daily.   30 tablet   1     Allergies Latex  History reviewed. No pertinent family history.  Social History Social History  Substance Use Topics  . Smoking status: Former Games developer  . Smokeless tobacco: None  . Alcohol Use: Yes     Comment: occ    Review of Systems Constitutional: No fever/chills Eyes: No visual changes. ENT: No sore throat. No difficulty swallowing. Cardiovascular: Denies chest pain. Respiratory: Denies shortness of breath. Gastrointestinal: No abdominal pain.  No nausea, no vomiting.   Genitourinary: Negative for  dysuria. Musculoskeletal: Negative for back pain. Skin: Positive for skin rash Neurological: Negative for headaches, focal weakness or numbness.  10-point ROS otherwise negative.  ____________________________________________   PHYSICAL EXAM:  VITAL SIGNS: ED Triage Vitals  Enc Vitals Group     BP 11/06/15 1125 124/78 mmHg     Pulse Rate 11/06/15 1125 70     Resp --      Temp 11/06/15 1125 98.1 F (36.7 C)     Temp Source 11/06/15 1125 Oral     SpO2 11/06/15 1125 100 %     Weight 11/06/15 1125 160 lb (72.576 kg)     Height 11/06/15 1125  (1.651 m)     Head Cir --      Peak Flow --      Pain Score 11/06/15 1143 6     Pain Loc --      Pain Edu? --      Excl. in GC? --     Constitutional: Alert and oriented. Well appearing and in no acute distress. Eyes: Conjunctivae are normal. PERRL. EOMI. Head: Atraumatic. Nose: No congestion/rhinnorhea. Mouth/Throat: Mucous membranes are moist.  Oropharynx non-erythematous. Neck: No stridor.  Supple. Hematological/Lymphatic/Immunilogical: No cervical lymphadenopathy. Cardiovascular: Normal rate, regular rhythm. Grossly normal heart sounds.  Good peripheral circulation. Respiratory: Normal respiratory effort.  No retractions. Lungs CTAB. Gastrointestinal: Soft and nontender. No distention.  Musculoskeletal: No lower extremity tenderness nor edema.  No joint effusions. Neurologic:  Normal speech and language. No gross focal neurologic deficits are appreciated. No gait instability. Skin:  Skin is warm, dry and intact. Irregular erythematous macular rash noted on the abdomen, left arm, and one area in the middle of her back. No areas are noted on the lower extremities at this time. Psychiatric: Mood and affect are normal. Speech and behavior are normal.  ____________________________________________   LABS (all labs ordered are listed, but only abnormal results are displayed)  Labs Reviewed - No data to  display   PROCEDURES  Procedure(s) performed: None  Critical Care performed: No  ____________________________________________   INITIAL IMPRESSION / ASSESSMENT AND PLAN / ED COURSE  Pertinent labs & imaging results that were available during my care of the patient were reviewed by me and considered in my medical decision making (see chart for details).  Patient was given Pepcid and Decadron while in the emergency room since she was driving. Patient improved during the course of her ER stay. She was told that she would need to take Benadryl as needed for itching however she is to begin a prednisone Dosepak and continue Zantac. She is follow-up with her doctor if any continued problems or an allergy specialist to determine what is the offending allergen. ____________________________________________   FINAL CLINICAL IMPRESSION(S) / ED DIAGNOSES  Final diagnoses:  Hives of unknown origin      Eather Colas 11/06/15 1255  Jene Every, MD 11/06/15 1524

## 2015-11-06 NOTE — Discharge Instructions (Signed)
Hives Hives are itchy, red, swollen areas of the skin. They can vary in size and location on your body. Hives can come and go for hours or several days (acute hives) or for several weeks (chronic hives). Hives do not spread from person to person (noncontagious). They may get worse with scratching, exercise, and emotional stress. CAUSES   Allergic reaction to food, additives, or drugs.  Infections, including the common cold.  Illness, such as vasculitis, lupus, or thyroid disease.  Exposure to sunlight, heat, or cold.  Exercise.  Stress.  Contact with chemicals. SYMPTOMS   Red or white swollen patches on the skin. The patches may change size, shape, and location quickly and repeatedly.  Itching.  Swelling of the hands, feet, and face. This may occur if hives develop deeper in the skin. DIAGNOSIS  Your caregiver can usually tell what is wrong by performing a physical exam. Skin or blood tests may also be done to determine the cause of your hives. In some cases, the cause cannot be determined. TREATMENT  Mild cases usually get better with medicines such as antihistamines. Severe cases may require an emergency epinephrine injection. If the cause of your hives is known, treatment includes avoiding that trigger.  HOME CARE INSTRUCTIONS   Avoid causes that trigger your hives.  Take antihistamines as directed by your caregiver to reduce the severity of your hives. Non-sedating or low-sedating antihistamines are usually recommended. Do not drive while taking an antihistamine.  Take any other medicines prescribed for itching as directed by your caregiver.  Wear loose-fitting clothing.  Keep all follow-up appointments as directed by your caregiver. SEEK MEDICAL CARE IF:   You have persistent or severe itching that is not relieved with medicine.  You have painful or swollen joints. SEEK IMMEDIATE MEDICAL CARE IF:   You have a fever.  Your tongue or lips are swollen.  You have  trouble breathing or swallowing.  You feel tightness in the throat or chest.  You have abdominal pain. These problems may be the first sign of a life-threatening allergic reaction. Call your local emergency services (911 in [place] MAKE SURE YOU:   Understand these instructions.  Will watch your condition.  Will get help right away if you are not doing well or get worse.   This information is not intended to replace advice given to you by your health care provider. Make sure you discuss any questions you have with your health care provider.   Document Released: 10/01/2005 Document Revised: 10/06/2013 Document Reviewed: 12/25/2011 [employer] Interactive Patient Education 2016 [employer]  

## 2015-11-06 NOTE — Discharge Instructions (Signed)
Hives Hives are itchy, red, swollen areas of the skin. They can vary in size and location on your body. Hives can come and go for hours or several days (acute hives) or for several weeks (chronic hives). Hives do not spread from person to person (noncontagious). They may get worse with scratching, exercise, and emotional stress. CAUSES   Allergic reaction to food, additives, or drugs.  Infections, including the common cold.  Illness, such as vasculitis, lupus, or thyroid disease.  Exposure to sunlight, heat, or cold.  Exercise.  Stress.  Contact with chemicals. SYMPTOMS   Red or white swollen patches on the skin. The patches may change size, shape, and location quickly and repeatedly.  Itching.  Swelling of the hands, feet, and face. This may occur if hives develop deeper in the skin. DIAGNOSIS  Your caregiver can usually tell what is wrong by performing a physical exam. Skin or blood tests may also be done to determine the cause of your hives. In some cases, the cause cannot be determined. TREATMENT  Mild cases usually get better with medicines such as antihistamines. Severe cases may require an emergency epinephrine injection. If the cause of your hives is known, treatment includes avoiding that trigger.  HOME CARE INSTRUCTIONS   Avoid causes that trigger your hives.  Take antihistamines as directed by your caregiver to reduce the severity of your hives. Non-sedating or low-sedating antihistamines are usually recommended. Do not drive while taking an antihistamine.  Take any other medicines prescribed for itching as directed by your caregiver.  Wear loose-fitting clothing.  Keep all follow-up appointments as directed by your caregiver. SEEK MEDICAL CARE IF:   You have persistent or severe itching that is not relieved with medicine.  You have painful or swollen joints. SEEK IMMEDIATE MEDICAL CARE IF:   You have a fever.  Your tongue or lips are swollen.  You have  trouble breathing or swallowing.  You feel tightness in the throat or chest.  You have abdominal pain. These problems may be the first sign of a life-threatening allergic reaction. Call your local emergency services (911 in [place] MAKE SURE YOU:   Understand these instructions.  Will watch your condition.  Will get help right away if you are not doing well or get worse.   This information is not intended to replace advice given to you by your health care provider. Make sure you discuss any questions you have with your health care provider.   Document Released: 10/01/2005 Document Revised: 10/06/2013 Document Reviewed: 12/25/2011 Elsevier Interactive Patient Education 2016 [employer].   Continue  taking Benadryl as needed for itching.  Prednisone as directed. Zantac twice a day until finished. Follow-up with your doctor or [facility] if any continued problems.

## 2018-02-27 LAB — HM PAP SMEAR: HM Pap smear: NORMAL

## 2019-06-01 ENCOUNTER — Encounter: Payer: Self-pay | Admitting: Emergency Medicine

## 2019-06-01 ENCOUNTER — Other Ambulatory Visit: Payer: Self-pay

## 2019-06-01 DIAGNOSIS — Y9289 Other specified places as the place of occurrence of the external cause: Secondary | ICD-10-CM | POA: Diagnosis not present

## 2019-06-01 DIAGNOSIS — R202 Paresthesia of skin: Secondary | ICD-10-CM | POA: Diagnosis not present

## 2019-06-01 DIAGNOSIS — S139XXA Sprain of joints and ligaments of unspecified parts of neck, initial encounter: Secondary | ICD-10-CM | POA: Diagnosis not present

## 2019-06-01 DIAGNOSIS — Z87891 Personal history of nicotine dependence: Secondary | ICD-10-CM | POA: Diagnosis not present

## 2019-06-01 DIAGNOSIS — R51 Headache: Secondary | ICD-10-CM | POA: Insufficient documentation

## 2019-06-01 DIAGNOSIS — Z91040 Latex allergy status: Secondary | ICD-10-CM | POA: Diagnosis not present

## 2019-06-01 DIAGNOSIS — Y998 Other external cause status: Secondary | ICD-10-CM | POA: Diagnosis not present

## 2019-06-01 DIAGNOSIS — S199XXA Unspecified injury of neck, initial encounter: Secondary | ICD-10-CM | POA: Diagnosis present

## 2019-06-01 DIAGNOSIS — Y93E6 Activity, residential relocation: Secondary | ICD-10-CM | POA: Insufficient documentation

## 2019-06-01 DIAGNOSIS — X509XXA Other and unspecified overexertion or strenuous movements or postures, initial encounter: Secondary | ICD-10-CM | POA: Diagnosis not present

## 2019-06-01 DIAGNOSIS — S134XXA Sprain of ligaments of cervical spine, initial encounter: Secondary | ICD-10-CM | POA: Diagnosis not present

## 2019-06-01 LAB — COMPREHENSIVE METABOLIC PANEL
ALT: 20 U/L (ref 0–44)
AST: 19 U/L (ref 15–41)
Albumin: 4.2 g/dL (ref 3.5–5.0)
Alkaline Phosphatase: 49 U/L (ref 38–126)
Anion gap: 8 (ref 5–15)
BUN: 16 mg/dL (ref 6–20)
CO2: 23 mmol/L (ref 22–32)
Calcium: 9.4 mg/dL (ref 8.9–10.3)
Chloride: 104 mmol/L (ref 98–111)
Creatinine, Ser: 0.85 mg/dL (ref 0.44–1.00)
GFR calc Af Amer: >60 mL/min (ref >60)
GFR calc non Af Amer: >60 mL/min (ref >60)
Glucose, Bld: 125 mg/dL — ABNORMAL HIGH (ref 70–99)
Potassium: 3.3 mmol/L — ABNORMAL LOW (ref 3.5–5.1)
Sodium: 135 mmol/L (ref 135–145)
Total Bilirubin: 0.6 mg/dL (ref 0.3–1.2)
Total Protein: 7.2 g/dL (ref 6.5–8.1)

## 2019-06-01 LAB — CBC WITH DIFFERENTIAL/PLATELET
Abs Immature Granulocytes: 0.04 10*3/uL (ref 0.00–0.07)
Basophils Absolute: 0 10*3/uL (ref 0.0–0.1)
Basophils Relative: 1 %
Eosinophils Absolute: 0.2 10*3/uL (ref 0.0–0.5)
Eosinophils Relative: 2 %
HCT: 39.5 % (ref 36.0–46.0)
Hemoglobin: 12.6 g/dL (ref 12.0–15.0)
Immature Granulocytes: 1 %
Lymphocytes Relative: 30 %
Lymphs Abs: 2.5 10*3/uL (ref 0.7–4.0)
MCH: 29.6 pg (ref 26.0–34.0)
MCHC: 31.9 g/dL (ref 30.0–36.0)
MCV: 92.7 fL (ref 80.0–100.0)
Monocytes Absolute: 0.7 10*3/uL (ref 0.1–1.0)
Monocytes Relative: 8 %
Neutro Abs: 5.1 10*3/uL (ref 1.7–7.7)
Neutrophils Relative %: 58 %
Platelets: 324 10*3/uL (ref 150–400)
RBC: 4.26 MIL/uL (ref 3.87–5.11)
RDW: 12.7 % (ref 11.5–15.5)
WBC: 8.6 10*3/uL (ref 4.0–10.5)
nRBC: 0 % (ref 0.0–0.2)

## 2019-06-01 LAB — TROPONIN I (HIGH SENSITIVITY): Troponin I (High Sensitivity): 3 ng/L (ref <18)

## 2019-06-01 NOTE — ED Triage Notes (Addendum)
Pt presents to ED with headache that started while she was at work around 1500 today. Pt states she went home to rest around 1800 the left side of her jaw started to hurt and the pain slowly radiated to the left side of her neck and her left arm started to tingle. Denies injury. Pt states she did some heavy lifting on Friday when she was moving her daughter into college. Denies chest pain or nausea. Alert and calm. Answering questions appropriately and speech clear.

## 2019-06-02 ENCOUNTER — Emergency Department
Admission: EM | Admit: 2019-06-02 | Discharge: 2019-06-02 | Disposition: A | Discharge status: Home / Self Care | Payer: 59 | Attending: Emergency Medicine | Admitting: Emergency Medicine

## 2019-06-02 ENCOUNTER — Emergency Department: Payer: 59

## 2019-06-02 DIAGNOSIS — S139XXA Sprain of joints and ligaments of unspecified parts of neck, initial encounter: Secondary | ICD-10-CM

## 2019-06-02 DIAGNOSIS — Z91040 Latex allergy status: Secondary | ICD-10-CM | POA: Diagnosis not present

## 2019-06-02 DIAGNOSIS — R51 Headache: Secondary | ICD-10-CM | POA: Diagnosis not present

## 2019-06-02 DIAGNOSIS — Z87891 Personal history of nicotine dependence: Secondary | ICD-10-CM | POA: Diagnosis not present

## 2019-06-02 DIAGNOSIS — R202 Paresthesia of skin: Secondary | ICD-10-CM | POA: Diagnosis not present

## 2019-06-02 HISTORY — DX: Gastro-esophageal reflux disease without esophagitis: K21.9

## 2019-06-02 MED ORDER — METHOCARBAMOL 500 MG PO TABS: 500.0000 mg | ORAL_TABLET | Freq: Three times a day (TID) | ORAL | 0 refills | Status: AC

## 2019-06-02 MED ORDER — HYDROCODONE-ACETAMINOPHEN 5-325 MG PO TABS
1.0000 | ORAL_TABLET | Freq: Once | ORAL | Status: AC
  Administered 2019-06-02: 1 via ORAL
  Filled 2019-06-02: qty 1

## 2019-06-02 MED ORDER — DICLOFENAC SODIUM 1 % TD GEL: 4.0000 g | Freq: Three times a day (TID) | TRANSDERMAL | 0 refills | Status: AC

## 2019-06-02 MED ORDER — KETOROLAC TROMETHAMINE 30 MG/ML IJ SOLN
15.0000 mg | Freq: Once | INTRAMUSCULAR | Status: AC
  Administered 2019-06-02: 15 mg via INTRAVENOUS
  Filled 2019-06-02: qty 1

## 2019-06-02 MED ORDER — DEXAMETHASONE SODIUM PHOSPHATE 10 MG/ML IJ SOLN
10.0000 mg | Freq: Once | INTRAMUSCULAR | Status: AC
  Administered 2019-06-02: 10 mg via INTRAVENOUS
  Filled 2019-06-02: qty 1

## 2019-06-02 MED ORDER — IOHEXOL 350 MG/ML SOLN
75.0000 mL | Freq: Once | INTRAVENOUS | Status: AC | PRN
  Administered 2019-06-02: 75 mL via INTRAVENOUS

## 2019-06-02 MED ORDER — HYDROCODONE-ACETAMINOPHEN 5-325 MG PO TABS: 1.0000 | ORAL_TABLET | ORAL | 0 refills | Status: DC | PRN

## 2019-06-02 NOTE — ED Notes (Signed)
Reviewed discharge instructions, follow-up care, and prescriptions with patient. Patient verbalized understanding of all information reviewed. Patient stable, with no distress noted at this time.    

## 2019-06-02 NOTE — ED Provider Notes (Signed)
[facility] Emergency Department Provider Note  ____________________________________________   First MD Initiated Contact with Patient 06/02/19 0159     (approximate)  I have reviewed the triage vital signs and the nursing notes.   HISTORY  Chief Complaint Headache, Neck Pain, and Numbness    HPI Connor A Mitzie NaBigelow is a 43 y.o. female  With h/o GERD here with neck pain, transient arm tingling. Pt reports that over the past day, she's had worsening mild headache and left neck pain. Pt reports that throughout today, she had a mild, frontal HA which she attributed to wearing a face shield @ work - she took ti off this PM and headache resolved. However, over the same time period she's had a mild, progressively worsening left anterior neck pain that is now severe, radiating down his left arm. No fever, chills. Of note, this did began after she helped her daughter move into college a few days ago. Denies any acute episodes of pain during that time btu did have some neck soreness after this. No sore throat, fever, cough. No ear pain. No appreciable swollen lymph nodes. She states that when she has more pain, she did have a few episodes of 'tingling' down her left arm but this is not constant. No LE symptoms.    Past Medical History:  Diagnosis Date   GERD (gastroesophageal reflux disease)     There are no active problems to display for this patient.   Past Surgical History:  Procedure Laterality Date   KNEE SURGERY      Prior to Admission medications   Medication Sig Start Date End Date Taking? Authorizing Provider  diclofenac sodium (VOLTAREN) 1 % GEL Apply 4 g topically 3 (three) times daily for 7 days. To left neck, area of pain 06/02/19 06/09/19  Shaune PollackIsaacs, Bellany Elbaum, MD  HYDROcodone-acetaminophen (NORCO/VICODIN) 5-325 MG tablet Take 1-2 tablets by mouth every 4 (four) hours as needed for moderate pain or severe pain. 06/02/19 06/01/20  Shaune PollackIsaacs, Haron Beilke, MD    methocarbamol (ROBAXIN) 500 MG tablet Take 1 tablet (500 mg total) by mouth 3 (three) times daily for 5 days. 06/02/19 06/07/19  Shaune PollackIsaacs, Leiby Pigeon, MD  predniSONE (DELTASONE) 10 MG tablet Take 6 tablets  today, on day 2 take 5 tablets, day 3 take 4 tablets, day 4 take 3 tablets, day 5 take  2 tablets and 1 tablet the last day 11/06/15   Tommi RumpsSummers, Rhonda L, PA-C  ranitidine (ZANTAC) 150 MG tablet Take 1 tablet (150 mg total) by mouth 2 (two) times daily. 11/06/15 11/05/16  Tommi RumpsSummers, Rhonda L, PA-C    Allergies Latex  No family history on file.  Social History Social History   Tobacco Use   Smoking status: Former Smoker   Smokeless tobacco: Never Used  Substance Use Topics   Alcohol use: Yes    Comment: occ   Drug use: No    Review of Systems  Review of Systems  Constitutional: Positive for fatigue.  Musculoskeletal: Positive for neck pain and neck stiffness.  Neurological: Positive for numbness and headaches.  All other systems reviewed and are negative.    ____________________________________________  PHYSICAL EXAM:      VITAL SIGNS: ED Triage Vitals  Enc Vitals Group     BP 06/01/19 2233 140/83     Pulse Rate 06/01/19 2233 77     Resp 06/01/19 2233 18     Temp 06/01/19 2233 98.1 F (36.7 C)     Temp Source 06/01/19 2233 Oral  SpO2 06/01/19 2233 99 %     Weight 06/01/19 2236 215 lb (97.5 kg)     Height 06/01/19 2236 5\' 5"  (1.651 m)     Head Circumference --      Peak Flow --      Pain Score 06/01/19 2234 9     Pain Loc --      Pain Edu? --      Excl. in GC? --      Physical Exam Vitals signs and nursing note reviewed.  Constitutional:      General: She is not in acute distress.    Appearance: She is well-developed.  HENT:     Head: Normocephalic and atraumatic.  Eyes:     Conjunctiva/sclera: Conjunctivae normal.  Neck:     Musculoskeletal: Neck supple.     Comments: Moderate TTP over left anterior SCM and left paracervical muscles. Negative Spurling's  test. Cardiovascular:     Rate and Rhythm: Normal rate and regular rhythm.     Heart sounds: Normal heart sounds. No murmur. No friction rub.  Pulmonary:     Effort: Pulmonary effort is normal. No respiratory distress.     Breath sounds: Normal breath sounds. No wheezing or rales.  Abdominal:     General: There is no distension.     Palpations: Abdomen is soft.     Tenderness: There is no abdominal tenderness.  Skin:    General: Skin is warm.     Capillary Refill: Capillary refill takes less than 2 seconds.  Neurological:     Mental Status: She is alert and oriented to person, place, and time.     Motor: No abnormal muscle tone.       ____________________________________________   LABS (all labs ordered are listed, but only abnormal results are displayed)  Labs Reviewed  COMPREHENSIVE METABOLIC PANEL - Abnormal; Notable for the following components:      Result Value   Potassium 3.3 (*)    Glucose, Bld 125 (*)    All other components within normal limits  CBC WITH DIFFERENTIAL/PLATELET  TROPONIN I (HIGH SENSITIVITY)    ____________________________________________  EKG: Normal sinus rhythm, VR 68. Normal intervals and axis. No acute ST-t segment changes. ________________________________________  RADIOLOGY All imaging, including plain films, CT scans, and ultrasounds, independently reviewed by me, and interpretations confirmed via formal radiology reads.  ED MD interpretation:   CTA Head/Neck: Negative  Official radiology report(s): Ct Angio Head W Or Wo Contrast  Result Date: 06/02/2019 CLINICAL DATA:  Initial evaluation for acute headache. EXAM: CT ANGIOGRAPHY HEAD AND NECK TECHNIQUE: Multidetector CT imaging of the head and neck was performed using the standard protocol during bolus administration of intravenous contrast. Multiplanar CT image reconstructions and MIPs were obtained to evaluate the vascular anatomy. Carotid stenosis measurements (when applicable) are  obtained utilizing NASCET criteria, using the distal internal carotid diameter as the denominator. CONTRAST:  75mL OMNIPAQUE IOHEXOL 350 MG/ML SOLN COMPARISON:  Prior brain MRI from 12/10/2007. FINDINGS: CT HEAD FINDINGS Brain: Cerebral volume within normal limits for patient age. No evidence for acute intracranial hemorrhage. No findings to suggest acute large vessel territory infarct. No mass lesion, midline shift, or mass effect. Ventricles are normal in size without evidence for hydrocephalus. No extra-axial fluid collection identified. Vascular: No hyperdense vessel identified. Skull: Scalp soft tissues demonstrate no acute abnormality. Calvarium intact. Sinuses/Orbits: Globes and orbital soft tissues within normal limits. Visualized paranasal sinuses are clear. No mastoid effusion. CTA NECK FINDINGS Aortic arch: Visualized aortic  arch of normal caliber with normal branch pattern. No hemodynamically significant stenosis about the origin of the great vessels. Visualized subclavian arteries widely patent. Right carotid system: Right common and internal carotid arteries widely patent without stenosis, dissection, or occlusion. Left carotid system: Left common and internal carotid arteries widely patent without stenosis, dissection, or occlusion. Vertebral arteries: Both vertebral arteries arise from the subclavian arteries. Left vertebral artery dominant. Vertebral arteries widely patent without stenosis, dissection or occlusion. Skeleton: No acute osseous abnormality. No discrete lytic or blastic osseous lesions. Other neck: No other acute soft tissue abnormality within the neck. Upper chest: Visualized upper chest demonstrates no acute finding. Review of the MIP images confirms the above findings CTA HEAD FINDINGS Anterior circulation: Internal carotid arteries widely patent to the termini without stenosis. A1 segments, anterior communicating artery common anterior cerebral arteries widely patent. No M1 stenosis  or occlusion. Normal MCA bifurcations. Distal MCA branches well perfused and symmetric. Posterior circulation: Vertebral arteries widely patent to the vertebrobasilar junction without stenosis. Posterior inferior cerebral arteries not well seen. Basilar widely patent to its distal aspect without stenosis. Superior cerebellar and posterior cerebral arteries widely patent bilaterally. Venous sinuses: Patent. Anatomic variants: None significant. No intracranial aneurysm or other vascular abnormality. Review of the MIP images confirms the above findings IMPRESSION: Normal CTA of the head and neck. No acute intracranial abnormality identified. Electronically Signed   By: Rise MuBenjamin  McClintock M.D.   On: 06/02/2019 03:37   Ct Angio Neck W And/or Wo Contrast  Result Date: 06/02/2019 CLINICAL DATA:  Initial evaluation for acute headache. EXAM: CT ANGIOGRAPHY HEAD AND NECK TECHNIQUE: Multidetector CT imaging of the head and neck was performed using the standard protocol during bolus administration of intravenous contrast. Multiplanar CT image reconstructions and MIPs were obtained to evaluate the vascular anatomy. Carotid stenosis measurements (when applicable) are obtained utilizing NASCET criteria, using the distal internal carotid diameter as the denominator. CONTRAST:  75mL OMNIPAQUE IOHEXOL 350 MG/ML SOLN COMPARISON:  Prior brain MRI from 12/10/2007. FINDINGS: CT HEAD FINDINGS Brain: Cerebral volume within normal limits for patient age. No evidence for acute intracranial hemorrhage. No findings to suggest acute large vessel territory infarct. No mass lesion, midline shift, or mass effect. Ventricles are normal in size without evidence for hydrocephalus. No extra-axial fluid collection identified. Vascular: No hyperdense vessel identified. Skull: Scalp soft tissues demonstrate no acute abnormality. Calvarium intact. Sinuses/Orbits: Globes and orbital soft tissues within normal limits. Visualized paranasal sinuses are  clear. No mastoid effusion. CTA NECK FINDINGS Aortic arch: Visualized aortic arch of normal caliber with normal branch pattern. No hemodynamically significant stenosis about the origin of the great vessels. Visualized subclavian arteries widely patent. Right carotid system: Right common and internal carotid arteries widely patent without stenosis, dissection, or occlusion. Left carotid system: Left common and internal carotid arteries widely patent without stenosis, dissection, or occlusion. Vertebral arteries: Both vertebral arteries arise from the subclavian arteries. Left vertebral artery dominant. Vertebral arteries widely patent without stenosis, dissection or occlusion. Skeleton: No acute osseous abnormality. No discrete lytic or blastic osseous lesions. Other neck: No other acute soft tissue abnormality within the neck. Upper chest: Visualized upper chest demonstrates no acute finding. Review of the MIP images confirms the above findings CTA HEAD FINDINGS Anterior circulation: Internal carotid arteries widely patent to the termini without stenosis. A1 segments, anterior communicating artery common anterior cerebral arteries widely patent. No M1 stenosis or occlusion. Normal MCA bifurcations. Distal MCA branches well perfused and symmetric. Posterior circulation: Vertebral arteries widely  patent to the vertebrobasilar junction without stenosis. Posterior inferior cerebral arteries not well seen. Basilar widely patent to its distal aspect without stenosis. Superior cerebellar and posterior cerebral arteries widely patent bilaterally. Venous sinuses: Patent. Anatomic variants: None significant. No intracranial aneurysm or other vascular abnormality. Review of the MIP images confirms the above findings IMPRESSION: Normal CTA of the head and neck. No acute intracranial abnormality identified. Electronically Signed   By: Jeannine Boga M.D.   On: 06/02/2019 03:37     ____________________________________________  PROCEDURES   Procedure(s) performed (including Critical Care):  Procedures  ____________________________________________  INITIAL IMPRESSION / MDM / [place] / ED COURSE  As part of my medical decision making, I reviewed the following data within the electronic MEDICAL RECORD NUMBER Notes from prior ED visits and [place] Controlled Substance Database      *Wana A Navejas was evaluated in Emergency Department on 06/02/2019 for the symptoms described in the history of present illness. She was evaluated in the context of the global COVID-19 pandemic, which necessitated consideration that the patient might be at risk for infection with the SARS-CoV-2 virus that causes COVID-19. Institutional protocols and algorithms that pertain to the evaluation of patients at risk for COVID-19 are in a state of rapid change based on information released by regulatory bodies including the CDC and federal and state organizations. These policies and algorithms were followed during the patient's care in the ED.  Some ED evaluations and interventions may be delayed as a result of limited staffing during the pandemic.*      Medical Decision Making: 43 yo F here with left paracervical and SCM pain. Suspect MSK/cervical strain. No UE weakness, numbness, or signs of radiculopathy or cord compression. CTAngio obtained 2/2 neck pain in setting of heavy lifting with anterior component, and is neg for dissection or aneurysm. HA mild, resolved, no fever or signs of meningitis or encephalitis. Will d/c with supportive care.  ____________________________________________  FINAL CLINICAL IMPRESSION(S) / ED DIAGNOSES  Final diagnoses:  Neck sprain, initial encounter     MEDICATIONS GIVEN DURING THIS VISIT:  Medications  dexamethasone (DECADRON) injection 10 mg (10 mg Intravenous Given 06/02/19 0235)  iohexol (OMNIPAQUE) 350 MG/ML injection 75 mL (75 mLs  Intravenous Contrast Given 06/02/19 0247)  ketorolac (TORADOL) 30 MG/ML injection 15 mg (15 mg Intravenous Given 06/02/19 0358)  HYDROcodone-acetaminophen (NORCO/VICODIN) 5-325 MG per tablet 1 tablet (1 tablet Oral Given 06/02/19 0357)     ED Discharge Orders         Ordered    HYDROcodone-acetaminophen (NORCO/VICODIN) 5-325 MG tablet  Every 4 hours PRN     06/02/19 0407    methocarbamol (ROBAXIN) 500 MG tablet  3 times daily     06/02/19 0407    diclofenac sodium (VOLTAREN) 1 % GEL  3 times daily     06/02/19 0407           Note:  This document was prepared using Dragon voice recognition software and may include unintentional dictation errors.   Duffy Bruce, MD 06/02/19 1003

## 2019-06-05 ENCOUNTER — Other Ambulatory Visit: Payer: Self-pay

## 2019-06-05 ENCOUNTER — Encounter: Payer: Self-pay | Admitting: Internal Medicine

## 2019-06-05 ENCOUNTER — Telehealth: Payer: Self-pay | Admitting: Internal Medicine

## 2019-06-05 ENCOUNTER — Ambulatory Visit (INDEPENDENT_AMBULATORY_CARE_PROVIDER_SITE_OTHER): Payer: 59 | Admitting: Internal Medicine

## 2019-06-05 VITALS — Ht 65.0 in | Wt 218.0 lb

## 2019-06-05 DIAGNOSIS — E559 Vitamin D deficiency, unspecified: Secondary | ICD-10-CM

## 2019-06-05 DIAGNOSIS — R739 Hyperglycemia, unspecified: Secondary | ICD-10-CM

## 2019-06-05 DIAGNOSIS — E876 Hypokalemia: Secondary | ICD-10-CM | POA: Insufficient documentation

## 2019-06-05 DIAGNOSIS — Z0000 Encounter for general adult medical examination without abnormal findings: Secondary | ICD-10-CM | POA: Diagnosis not present

## 2019-06-05 DIAGNOSIS — Z1329 Encounter for screening for other suspected endocrine disorder: Secondary | ICD-10-CM

## 2019-06-05 DIAGNOSIS — Z1211 Encounter for screening for malignant neoplasm of colon: Secondary | ICD-10-CM | POA: Insufficient documentation

## 2019-06-05 DIAGNOSIS — Z13220 Encounter for screening for lipoid disorders: Secondary | ICD-10-CM | POA: Diagnosis not present

## 2019-06-05 DIAGNOSIS — Z1231 Encounter for screening mammogram for malignant neoplasm of breast: Secondary | ICD-10-CM | POA: Diagnosis not present

## 2019-06-05 DIAGNOSIS — I10 Essential (primary) hypertension: Secondary | ICD-10-CM | POA: Insufficient documentation

## 2019-06-05 DIAGNOSIS — Z1389 Encounter for screening for other disorder: Secondary | ICD-10-CM | POA: Diagnosis not present

## 2019-06-05 DIAGNOSIS — E611 Iron deficiency: Secondary | ICD-10-CM

## 2019-06-05 DIAGNOSIS — E669 Obesity, unspecified: Secondary | ICD-10-CM | POA: Insufficient documentation

## 2019-06-05 HISTORY — DX: Hypokalemia: E87.6

## 2019-06-05 MED ORDER — AMLODIPINE BESYLATE 2.5 MG PO TABS: 2.5000 mg | ORAL_TABLET | Freq: Every day | ORAL | 3 refills | Status: DC

## 2019-06-05 NOTE — Patient Instructions (Signed)
Exercising to Lose Weight Exercise is structured, repetitive physical activity to improve fitness and health. Getting regular exercise is important for everyone. It is especially important if you are overweight. Being overweight increases your risk of heart disease, stroke, diabetes, high blood pressure, and several types of cancer. Reducing your calorie intake and exercising can help you lose weight. Exercise is usually categorized as moderate or vigorous intensity. To lose weight, most people need to do a certain amount of moderate-intensity or vigorous-intensity exercise each week. Moderate-intensity exercise  Moderate-intensity exercise is any activity that gets you moving enough to burn at least three times more energy (calories) than if you were sitting. Examples of moderate exercise include:  Walking a mile in 15 minutes.  Doing light yard work.  Biking at an easy pace. Most people should get at least 150 minutes (2 hours and 30 minutes) a week of moderate-intensity exercise to maintain their body weight. Vigorous-intensity exercise Vigorous-intensity exercise is any activity that gets you moving enough to burn at least six times more calories than if you were sitting. When you exercise at this intensity, you should be working hard enough that you are not able to carry on a conversation. Examples of vigorous exercise include:  Running.  Playing a team sport, such as football, basketball, and soccer.  Jumping rope. Most people should get at least 75 minutes (1 hour and 15 minutes) a week of vigorous-intensity exercise to maintain their body weight. How can exercise affect me? When you exercise enough to burn more calories than you eat, you lose weight. Exercise also reduces body fat and builds muscle. The more muscle you have, the more calories you burn. Exercise also:  Improves mood.  Reduces stress and tension.  Improves your overall fitness, flexibility, and  endurance.  Increases bone strength. The amount of exercise you need to lose weight depends on:  Your age.  The type of exercise.  Any health conditions you have.  Your overall physical ability. Talk to your health care provider about how much exercise you need and what types of activities are safe for you. What actions can I take to lose weight? Nutrition   Make changes to your diet as told by your health care provider or diet and nutrition specialist (dietitian). This may include: ? Eating fewer calories. ? Eating more protein. ? Eating less unhealthy fats. ? Eating a diet that includes fresh fruits and vegetables, whole grains, low-fat dairy products, and lean protein. ? Avoiding foods with added fat, salt, and sugar.  Drink plenty of water while you exercise to prevent dehydration or heat stroke. Activity  Choose an activity that you enjoy and set realistic goals. Your health care provider can help you make an exercise plan that works for you.  Exercise at a moderate or vigorous intensity most days of the week. ? The intensity of exercise may vary from person to person. You can tell how intense a workout is for you by paying attention to your breathing and heartbeat. Most people will notice their breathing and heartbeat get faster with more intense exercise.  Do resistance training twice each week, such as: ? Push-ups. ? Sit-ups. ? Lifting weights. ? Using resistance bands.  Getting short amounts of exercise can be just as helpful as long structured periods of exercise. If you have trouble finding time to exercise, try to include exercise in your daily routine. ? Get up, stretch, and walk around every 30 minutes throughout the day. ? Go for a  walk during your lunch break. ? Park your car farther away from your destination. ? If you take public transportation, get off one stop early and walk the rest of the way. ? Make phone calls while standing up and walking  around. ? Take the stairs instead of elevators or escalators.  Wear comfortable clothes and shoes with good support.  Do not exercise so much that you hurt yourself, feel dizzy, or get very short of breath. Where to find more information  [place] Department of Health and Human Services: BondedCompany.at  Centers for Disease Control and Prevention (CDC): http://www.wolf.info/ Contact a health care provider:  Before starting a new exercise program.  If you have questions or concerns about your weight.  If you have a medical problem that keeps you from exercising. Get help right away if you have any of the following while exercising:  Injury.  Dizziness.  Difficulty breathing or shortness of breath that does not go away when you stop exercising.  Chest pain.  Rapid heartbeat. Summary  Being overweight increases your risk of heart disease, stroke, diabetes, high blood pressure, and several types of cancer.  Losing weight happens when you burn more calories than you eat.  Reducing the amount of calories you eat in addition to getting regular moderate or vigorous exercise each week helps you lose weight. This information is not intended to replace advice given to you by your health care provider. Make sure you discuss any questions you have with your health care provider. Document Released: 11/03/2010 Document Revised: 10/14/2017 Document Reviewed: 10/14/2017 Elsevier Patient Education  2020 [employer] DASH stands for "Dietary Approaches to Stop Hypertension." The DASH eating plan is a healthy eating plan that has been shown to reduce high blood pressure (hypertension). It may also reduce your risk for type 2 diabetes, heart disease, and stroke. The DASH eating plan may also help with weight loss. What are tips for following this plan?  General guidelines  Avoid eating more than 2,300 mg (milligrams) of salt (sodium) a day. If you have hypertension, you may need to reduce  your sodium intake to 1,500 mg a day.  Limit alcohol intake to no more than 1 drink a day for nonpregnant women and 2 drinks a day for men. One drink equals 12 oz of beer, 5 oz of wine, or 1 oz of hard liquor.  Work with your health care provider to maintain a healthy body weight or to lose weight. Ask what an ideal weight is for you.  Get at least 30 minutes of exercise that causes your heart to beat faster (aerobic exercise) most days of the week. Activities may include walking, swimming, or biking.  Work with your health care provider or diet and nutrition specialist (dietitian) to adjust your eating plan to your individual calorie needs. Reading food labels   Check food labels for the amount of sodium per serving. Choose foods with less than 5 percent of the Daily Value of sodium. Generally, foods with less than 300 mg of sodium per serving fit into this eating plan.  To find whole grains, look for the word "whole" as the first word in the ingredient list. Shopping  Buy products labeled as "low-sodium" or "no salt added."  Buy fresh foods. Avoid canned foods and premade or frozen meals. Cooking  Avoid adding salt when cooking. Use salt-free seasonings or herbs instead of table salt or sea salt. Check with your health care provider or pharmacist before  using salt substitutes.  Do not fry foods. Cook foods using healthy methods such as baking, boiling, grilling, and broiling instead.  Cook with heart-healthy oils, such as olive, canola, soybean, or sunflower oil. Meal planning  Eat a balanced diet that includes: ? 5 or more servings of fruits and vegetables each day. At each meal, try to fill half of your plate with fruits and vegetables. ? Up to 6-8 servings of whole grains each day. ? Less than 6 oz of lean meat, poultry, or fish each day. A 3-oz serving of meat is about the same size as a deck of cards. One egg equals 1 oz. ? 2 servings of low-fat dairy each day. ? A serving  of nuts, seeds, or beans 5 times each week. ? Heart-healthy fats. Healthy fats called Omega-3 fatty acids are found in foods such as flaxseeds and coldwater fish, like sardines, salmon, and mackerel.  Limit how much you eat of the following: ? Canned or prepackaged foods. ? Food that is high in trans fat, such as fried foods. ? Food that is high in saturated fat, such as fatty meat. ? Sweets, desserts, sugary drinks, and other foods with added sugar. ? Full-fat dairy products.  Do not salt foods before eating.  Try to eat at least 2 vegetarian meals each week.  Eat more home-cooked food and less restaurant, buffet, and fast food.  When eating at a restaurant, ask that your food be prepared with less salt or no salt, if possible. What foods are recommended? The items listed may not be a complete list. Talk with your dietitian about what dietary choices are best for you. Grains Whole-grain or whole-wheat bread. Whole-grain or whole-wheat pasta. Brown rice. Orpah Cobbatmeal. Quinoa. Bulgur. Whole-grain and low-sodium cereals. Pita bread. Low-fat, low-sodium crackers. Whole-wheat flour tortillas. Vegetables Fresh or frozen vegetables (raw, steamed, roasted, or grilled). Low-sodium or reduced-sodium tomato and vegetable juice. Low-sodium or reduced-sodium tomato sauce and tomato paste. Low-sodium or reduced-sodium canned vegetables. Fruits All fresh, dried, or frozen fruit. Canned fruit in natural juice (without added sugar). Meat and other protein foods Skinless chicken or [place]. Ground chicken or [place]. Pork with fat trimmed off. Fish and seafood. Egg whites. Dried beans, peas, or lentils. Unsalted nuts, nut butters, and seeds. Unsalted canned beans. Lean cuts of beef with fat trimmed off. Low-sodium, lean deli meat. Dairy Low-fat (1%) or fat-free (skim) milk. Fat-free, low-fat, or reduced-fat cheeses. Nonfat, low-sodium ricotta or cottage cheese. Low-fat or nonfat yogurt. Low-fat, low-sodium  cheese. Fats and oils Soft margarine without trans fats. Vegetable oil. Low-fat, reduced-fat, or light mayonnaise and salad dressings (reduced-sodium). Canola, safflower, olive, soybean, and sunflower oils. Avocado. Seasoning and other foods Herbs. Spices. Seasoning mixes without salt. Unsalted popcorn and pretzels. Fat-free sweets. What foods are not recommended? The items listed may not be a complete list. Talk with your dietitian about what dietary choices are best for you. Grains Baked goods made with fat, such as croissants, muffins, or some breads. Dry pasta or rice meal packs. Vegetables Creamed or fried vegetables. Vegetables in a cheese sauce. Regular canned vegetables (not low-sodium or reduced-sodium). Regular canned tomato sauce and paste (not low-sodium or reduced-sodium). Regular tomato and vegetable juice (not low-sodium or reduced-sodium). Rosita FirePickles. Olives. Fruits Canned fruit in a light or heavy syrup. Fried fruit. Fruit in cream or butter sauce. Meat and other protein foods Fatty cuts of meat. Ribs. Fried meat. Tomasa BlaseBacon. Sausage. Bologna and other processed lunch meats. Salami. Fatback. Hotdogs. Bratwurst. Salted nuts and  seeds. Canned beans with added salt. Canned or smoked fish. Whole eggs or egg yolks. Chicken or [place] with skin. Dairy Whole or 2% milk, cream, and half-and-half. Whole or full-fat cream cheese. Whole-fat or sweetened yogurt. Full-fat cheese. Nondairy creamers. Whipped toppings. Processed cheese and cheese spreads. Fats and oils Butter. Stick margarine. Lard. Shortening. Ghee. Bacon fat. Tropical oils, such as coconut, palm kernel, or palm oil. Seasoning and other foods Salted popcorn and pretzels. Onion salt, garlic salt, seasoned salt, table salt, and sea salt. Worcestershire sauce. Tartar sauce. Barbecue sauce. Teriyaki sauce. Soy sauce, including reduced-sodium. Steak sauce. Canned and packaged gravies. Fish sauce. Oyster sauce. Cocktail sauce. Horseradish  that you find on the shelf. Ketchup. Mustard. Meat flavorings and tenderizers. Bouillon cubes. Hot sauce and Tabasco sauce. Premade or packaged marinades. Premade or packaged taco seasonings. Relishes. Regular salad dressings. Where to find more information:  National Heart, Lung, and Blood Institute: PopSteam.is  American Heart Association: www.heart.org Summary  The DASH eating plan is a healthy eating plan that has been shown to reduce high blood pressure (hypertension). It may also reduce your risk for type 2 diabetes, heart disease, and stroke.  With the DASH eating plan, you should limit salt (sodium) intake to 2,300 mg a day. If you have hypertension, you may need to reduce your sodium intake to 1,500 mg a day.  When on the DASH eating plan, aim to eat more fresh fruits and vegetables, whole grains, lean proteins, low-fat dairy, and heart-healthy fats.  Work with your health care provider or diet and nutrition specialist (dietitian) to adjust your eating plan to your individual calorie needs. This information is not intended to replace advice given to you by your health care provider. Make sure you discuss any questions you have with your health care provider. Document Released: 09/20/2011 Document Revised: 09/13/2017 Document Reviewed: 09/24/2016 Elsevier Patient Education  2020 [employer].  Hypertension, Adult High blood pressure (hypertension) is when the force of blood pumping through the arteries is too strong. The arteries are the blood vessels that carry blood from the heart throughout the body. Hypertension forces the heart to work harder to pump blood and may cause arteries to become narrow or stiff. Untreated or uncontrolled hypertension can cause a heart attack, heart failure, a stroke, kidney disease, and other problems. A blood pressure reading consists of a higher number over a lower number. Ideally, your blood pressure should be below 120/80. The first ("top")  number is called the systolic pressure. It is a measure of the pressure in your arteries as your heart beats. The second ("bottom") number is called the diastolic pressure. It is a measure of the pressure in your arteries as the heart relaxes. What are the causes? The exact cause of this condition is not known. There are some conditions that result in or are related to high blood pressure. What increases the risk? Some risk factors for high blood pressure are under your control. The following factors may make you more likely to develop this condition:  Smoking.  Having type 2 diabetes mellitus, high cholesterol, or both.  Not getting enough exercise or physical activity.  Being overweight.  Having too much fat, sugar, calories, or salt (sodium) in your diet.  Drinking too much alcohol. Some risk factors for high blood pressure may be difficult or impossible to change. Some of these factors include:  Having chronic kidney disease.  Having a family history of high blood pressure.  Age. Risk increases with age.  Race. You may be at higher risk if you are African American.  Gender. Men are at higher risk than women before age 42. After age 42, women are at higher risk than men.  Having obstructive sleep apnea.  Stress. What are the signs or symptoms? High blood pressure may not cause symptoms. Very high blood pressure (hypertensive crisis) may cause:  Headache.  Anxiety.  Shortness of breath.  Nosebleed.  Nausea and vomiting.  Vision changes.  Severe chest pain.  Seizures. How is this diagnosed? This condition is diagnosed by measuring your blood pressure while you are seated, with your arm resting on a flat surface, your legs uncrossed, and your feet flat on the floor. The cuff of the blood pressure monitor will be placed directly against the skin of your upper arm at the level of your heart. It should be measured at least twice using the same arm. Certain conditions  can cause a difference in blood pressure between your right and left arms. Certain factors can cause blood pressure readings to be lower or higher than normal for a short period of time:  When your blood pressure is higher when you are in a health care provider's office than when you are at home, this is called white coat hypertension. Most people with this condition do not need medicines.  When your blood pressure is higher at home than when you are in a health care provider's office, this is called masked hypertension. Most people with this condition may need medicines to control blood pressure. If you have a high blood pressure reading during one visit or you have normal blood pressure with other risk factors, you may be asked to:  Return on a different day to have your blood pressure checked again.  Monitor your blood pressure at home for 1 week or longer. If you are diagnosed with hypertension, you may have other blood or imaging tests to help your health care provider understand your overall risk for other conditions. How is this treated? This condition is treated by making healthy lifestyle changes, such as eating healthy foods, exercising more, and reducing your alcohol intake. Your health care provider may prescribe medicine if lifestyle changes are not enough to get your blood pressure under control, and if:  Your systolic blood pressure is above 130.  Your diastolic blood pressure is above 80. Your personal target blood pressure may vary depending on your medical conditions, your age, and other factors. Follow these instructions at home: Eating and drinking   Eat a diet that is high in fiber and potassium, and low in sodium, added sugar, and fat. An example eating plan is called the DASH (Dietary Approaches to Stop Hypertension) diet. To eat this way: ? Eat plenty of fresh fruits and vegetables. Try to fill one half of your plate at each meal with fruits and vegetables. ? Eat  whole grains, such as whole-wheat pasta, brown rice, or whole-grain bread. Fill about one fourth of your plate with whole grains. ? Eat or drink low-fat dairy products, such as skim milk or low-fat yogurt. ? Avoid fatty cuts of meat, processed or cured meats, and poultry with skin. Fill about one fourth of your plate with lean proteins, such as fish, chicken without skin, beans, eggs, or tofu. ? Avoid pre-made and processed foods. These tend to be higher in sodium, added sugar, and fat.  Reduce your daily sodium intake. Most people with hypertension should eat less than 1,500 mg of sodium a day.  Do not drink alcohol if: ? Your health care provider tells you not to drink. ? You are pregnant, may be pregnant, or are planning to become pregnant.  If you drink alcohol: ? Limit how much you use to:  0-1 drink a day for women.  0-2 drinks a day for men. ? Be aware of how much alcohol is in your drink. In the [place] one drink equals one 12 oz bottle of beer (355 mL), one 5 oz glass of wine (148 mL), or one 1 oz glass of hard liquor (44 mL). Lifestyle   Work with your health care provider to maintain a healthy body weight or to lose weight. Ask what an ideal weight is for you.  Get at least 30 minutes of exercise most days of the week. Activities may include walking, swimming, or biking.  Include exercise to strengthen your muscles (resistance exercise), such as Pilates or lifting weights, as part of your weekly exercise routine. Try to do these types of exercises for 30 minutes at least 3 days a week.  Do not use any products that contain nicotine or tobacco, such as cigarettes, e-cigarettes, and chewing tobacco. If you need help quitting, ask your health care provider.  Monitor your blood pressure at home as told by your health care provider.  Keep all follow-up visits as told by your health care provider. This is important. Medicines  Take over-the-counter and prescription medicines  only as told by your health care provider. Follow directions carefully. Blood pressure medicines must be taken as prescribed.  Do not skip doses of blood pressure medicine. Doing this puts you at risk for problems and can make the medicine less effective.  Ask your health care provider about side effects or reactions to medicines that you should watch for. Contact a health care provider if you:  Think you are having a reaction to a medicine you are taking.  Have headaches that keep coming back (recurring).  Feel dizzy.  Have swelling in your ankles.  Have trouble with your vision. Get help right away if you:  Develop a severe headache or confusion.  Have unusual weakness or numbness.  Feel faint.  Have severe pain in your chest or abdomen.  Vomit repeatedly.  Have trouble breathing. Summary  Hypertension is when the force of blood pumping through your arteries is too strong. If this condition is not controlled, it may put you at risk for serious complications.  Your personal target blood pressure may vary depending on your medical conditions, your age, and other factors. For most people, a normal blood pressure is less than 120/80.  Hypertension is treated with lifestyle changes, medicines, or a combination of both. Lifestyle changes include losing weight, eating a healthy, low-sodium diet, exercising more, and limiting alcohol. This information is not intended to replace advice given to you by your health care provider. Make sure you discuss any questions you have with your health care provider. Document Released: 10/01/2005 Document Revised: 06/11/2018 Document Reviewed: 06/11/2018 Elsevier Patient Education  2020 [employer]

## 2019-06-05 NOTE — Telephone Encounter (Signed)
I called pt and left vm for pt to call ofc to sch Confirm insuranc e  F/u in 4-6 weeks

## 2019-06-05 NOTE — Progress Notes (Signed)
Virtual Visit via Video Note  I connected with Cassandra Cardenas   on 06/05/19 at  9:10 AM EDT by a video enabled telemedicine application and verified that I am speaking with the correct person using two identifiers.  Location patient: home Location provider:work or home office Persons participating in the virtual visit: patient, provider  I discussed the limitations of evaluation and management by telemedicine and the availability of in person appointments. The patient expressed understanding and agreed to proceed.   HPI: 1. Annual due to [employer] insurance  2. HTN since 2017 no meds she gets h/a when BP high  3. Obesity weighs 215-18 lbs 5'5" not currently exercising  4. ED 06/01/19 for neck strain     ROS: See pertinent positives and negatives per HPI. General:  Wt increased  HEENT: no sore throat  CV: no chest pain  Lungs: no sob  GI: no ab pain  GU: no issues  MSK: h/o neck pain  Neuro: no h/a today Psych: no anxiety/depression Skin no issues   Past Medical History:  Diagnosis Date  . GERD (gastroesophageal reflux disease)   . Headache   . HTN (hypertension)     Past Surgical History:  Procedure Laterality Date  . KNEE SURGERY     left knee arthroscopy 43 y.o   . TUBAL LIGATION    . WISDOM TOOTH EXTRACTION      Family History  Problem Relation Age of Onset  . Hypertension Mother   . Cancer Father        ? type mets  . Multiple sclerosis Sister   . Cancer Brother        pancreatitic age 42    SOCIAL HX: works [employer] 3 kids long term partner of 6 years     Current Outpatient Medications:  .  diclofenac sodium (VOLTAREN) 1 % GEL, Apply 4 g topically 3 (three) times daily for 7 days. To left neck, area of pain, Disp: 100 g, Rfl: 0 .  HYDROcodone-acetaminophen (NORCO/VICODIN) 5-325 MG tablet, Take 1-2 tablets by mouth every 4 (four) hours as needed for moderate pain or severe pain., Disp: 12 tablet, Rfl: 0 .  methocarbamol (ROBAXIN) 500 MG tablet, Take 1  tablet (500 mg total) by mouth 3 (three) times daily for 5 days., Disp: 15 tablet, Rfl: 0 .  amLODipine (NORVASC) 2.5 MG tablet, Take 1 tablet (2.5 mg total) by mouth daily., Disp: 90 tablet, Rfl: 3 .  predniSONE (DELTASONE) 10 MG tablet, Take 6 tablets  today, on day 2 take 5 tablets, day 3 take 4 tablets, day 4 take 3 tablets, day 5 take  2 tablets and 1 tablet the last day (Patient not taking: Reported on 06/05/2019), Disp: 21 tablet, Rfl: 0 .  ranitidine (ZANTAC) 150 MG tablet, Take 1 tablet (150 mg total) by mouth 2 (two) times daily., Disp: 30 tablet, Rfl: 1  EXAM:  VITALS per patient if applicable:  GENERAL: alert, oriented, appears well and in no acute distress  HEENT: atraumatic, conjunttiva clear, no obvious abnormalities on inspection of external nose and ears  NECK: normal movements of the head and neck  LUNGS: on inspection no signs of respiratory distress, breathing rate appears normal, no obvious gross SOB, gasping or wheezing  CV: no obvious cyanosis  MS: moves all visible extremities without noticeable abnormality  PSYCH/NEURO: pleasant and cooperative, no obvious depression or anxiety, speech and thought processing grossly intact  ASSESSMENT AND PLAN:  Discussed the following assessment and plan:  Annual physical  exam Flu shot at work  Tdap ? Year  [facility] ok with obtain records h/o abnormal pap  Declines STD check  rec healthy diet and exercise   Essential hypertension - Plan: Basic Metabolic Panel (BMET), amLODipine (NORVASC) 2.5 MG tablet  Hyperglycemia - Plan: Hemoglobin A1c  Hypokalemia - Plan: Basic Metabolic Panel (BMET), Magnesium   Iron deficiency - Plan: Ferritin, Iron, Iron Binding Cap (TIBC)([facility])  Obesity (BMI 30-39.9) See above      I discussed the assessment and treatment plan with the patient. The patient was provided an opportunity to ask questions and all were answered. The patient agreed with  the plan and demonstrated an understanding of the instructions.   The patient was advised to call back or seek an in-person evaluation if the symptoms worsen or if the condition fails to improve as anticipated.   Nino Glow McLean-Scocuzza, MD

## 2019-06-07 ENCOUNTER — Other Ambulatory Visit
Admission: RE | Admit: 2019-06-07 | Discharge: 2019-06-07 | Disposition: A | Discharge status: Home / Self Care | Payer: 59 | Attending: Internal Medicine | Admitting: Internal Medicine

## 2019-06-07 DIAGNOSIS — R739 Hyperglycemia, unspecified: Secondary | ICD-10-CM | POA: Insufficient documentation

## 2019-06-07 DIAGNOSIS — Z1329 Encounter for screening for other suspected endocrine disorder: Secondary | ICD-10-CM | POA: Insufficient documentation

## 2019-06-07 DIAGNOSIS — E611 Iron deficiency: Secondary | ICD-10-CM | POA: Diagnosis not present

## 2019-06-07 DIAGNOSIS — I10 Essential (primary) hypertension: Secondary | ICD-10-CM | POA: Insufficient documentation

## 2019-06-07 DIAGNOSIS — Z13220 Encounter for screening for lipoid disorders: Secondary | ICD-10-CM | POA: Diagnosis not present

## 2019-06-07 DIAGNOSIS — E876 Hypokalemia: Secondary | ICD-10-CM | POA: Diagnosis not present

## 2019-06-07 DIAGNOSIS — E559 Vitamin D deficiency, unspecified: Secondary | ICD-10-CM | POA: Diagnosis not present

## 2019-06-07 LAB — IRON AND TIBC
Iron: 58 ug/dL (ref 28–170)
Saturation Ratios: 15 % (ref 10.4–31.8)
TIBC: 397 ug/dL (ref 250–450)
UIBC: 339 ug/dL

## 2019-06-07 LAB — MAGNESIUM: Magnesium: 1.9 mg/dL (ref 1.7–2.4)

## 2019-06-07 LAB — HEMOGLOBIN A1C
Hgb A1c MFr Bld: 5.3 % (ref 4.8–5.6)
Mean Plasma Glucose: 105.41 mg/dL

## 2019-06-07 LAB — BASIC METABOLIC PANEL
Anion gap: 9 (ref 5–15)
BUN: 10 mg/dL (ref 6–20)
CO2: 23 mmol/L (ref 22–32)
Calcium: 8.9 mg/dL (ref 8.9–10.3)
Chloride: 104 mmol/L (ref 98–111)
Creatinine, Ser: 0.79 mg/dL (ref 0.44–1.00)
GFR calc Af Amer: >60 mL/min (ref >60)
GFR calc non Af Amer: >60 mL/min (ref >60)
Glucose, Bld: 108 mg/dL — ABNORMAL HIGH (ref 70–99)
Potassium: 4 mmol/L (ref 3.5–5.1)
Sodium: 136 mmol/L (ref 135–145)

## 2019-06-07 LAB — LIPID PANEL
Cholesterol: 186 mg/dL (ref 0–200)
HDL: 70 mg/dL (ref >40)
LDL Cholesterol: 105 mg/dL — ABNORMAL HIGH (ref 0–99)
Total CHOL/HDL Ratio: 2.7 RATIO
Triglycerides: 57 mg/dL (ref <150)
VLDL: 11 mg/dL (ref 0–40)

## 2019-06-07 LAB — FERRITIN: Ferritin: 9 ng/mL — ABNORMAL LOW (ref 11–307)

## 2019-06-07 LAB — TSH: TSH: 3.748 u[IU]/mL (ref 0.350–4.500)

## 2019-06-09 ENCOUNTER — Other Ambulatory Visit: Payer: Self-pay | Admitting: Internal Medicine

## 2019-06-09 ENCOUNTER — Encounter: Payer: Self-pay | Admitting: *Deleted

## 2019-06-09 DIAGNOSIS — E559 Vitamin D deficiency, unspecified: Secondary | ICD-10-CM

## 2019-06-09 LAB — VITAMIN D 25 HYDROXY (VIT D DEFICIENCY, FRACTURES): Vit D, 25-Hydroxy: 13.4 ng/mL — ABNORMAL LOW (ref 30.0–100.0)

## 2019-06-09 MED ORDER — CHOLECALCIFEROL 1.25 MG (50000 UT) PO CAPS: 50000.0000 [IU] | ORAL_CAPSULE | ORAL | 1 refills | Status: DC

## 2019-06-26 ENCOUNTER — Encounter: Payer: Self-pay | Admitting: Internal Medicine

## 2019-08-10 ENCOUNTER — Other Ambulatory Visit: Payer: Self-pay

## 2019-08-12 ENCOUNTER — Other Ambulatory Visit: Payer: Self-pay

## 2019-08-12 ENCOUNTER — Ambulatory Visit (INDEPENDENT_AMBULATORY_CARE_PROVIDER_SITE_OTHER): Payer: 59 | Admitting: Internal Medicine

## 2019-08-12 ENCOUNTER — Encounter: Payer: Self-pay | Admitting: Internal Medicine

## 2019-08-12 VITALS — BP 108/78 | HR 75 | Temp 97.9°F | Ht 65.0 in | Wt 217.0 lb

## 2019-08-12 DIAGNOSIS — I10 Essential (primary) hypertension: Secondary | ICD-10-CM

## 2019-08-12 DIAGNOSIS — M25561 Pain in right knee: Secondary | ICD-10-CM | POA: Diagnosis not present

## 2019-08-12 DIAGNOSIS — M542 Cervicalgia: Secondary | ICD-10-CM

## 2019-08-12 DIAGNOSIS — E669 Obesity, unspecified: Secondary | ICD-10-CM

## 2019-08-12 DIAGNOSIS — G8929 Other chronic pain: Secondary | ICD-10-CM | POA: Diagnosis not present

## 2019-08-12 NOTE — Progress Notes (Signed)
Chief Complaint  Patient presents with  . Follow-up   F/u  1.c/o left neck pain with ROM and radiates to left face not related to braces she had since 1 month ago on the top. She c/o left bulge in left neck  She also c/o left upper arm numbness and tingling  Reviewed CT neck 06/02/19 with radiologist no adenopathy, no thyroid issues, SCM seems prominent, no sig disc etiology on CTA neck but will refer to ortho consider MRI cervical in future no need for US neck at this time radiology agrees    Pain is mild to moderate  2. C/o right knee pain worse at night s/p arthroscopic procedure age 16 y.o for injury nothing tried. Pain is mild to moderate  3. HTN BP improved no norvasc 2.5 mg qd  4. Obesity BMI 36.11    Review of Systems  Constitutional: Negative for weight loss.  HENT: Negative for hearing loss.   Eyes: Negative for blurred vision.  Respiratory: Negative for shortness of breath.   Cardiovascular: Negative for chest pain.  Gastrointestinal: Negative for abdominal pain.  Musculoskeletal: Positive for joint pain and neck pain.  Skin: Negative for rash.  Neurological: Negative for headaches.  Psychiatric/Behavioral: Negative for depression.   Past Medical History:  Diagnosis Date  . GERD (gastroesophageal reflux disease)   . Headache   . HTN (hypertension)    Past Surgical History:  Procedure Laterality Date  . KNEE SURGERY     left knee arthroscopy 43 y.o   . TUBAL LIGATION    . WISDOM TOOTH EXTRACTION     Family History  Problem Relation Age of Onset  . Hypertension Mother   . Cancer Father        ? type mets  . Multiple sclerosis Sister   . Cancer Brother        pancreatitic age 75   Social History   Socioeconomic History  . Marital status: Single    Spouse name: Not on file  . Number of children: Not on file  . Years of education: Not on file  . Highest education level: Not on file  Occupational History  . Not on file  Social Needs  . Financial  resource strain: Not on file  . Food insecurity    Worry: Not on file    Inability: Not on file  . Transportation needs    Medical: Not on file    Non-medical: Not on file  Tobacco Use  . Smoking status: Former Games developer  . Smokeless tobacco: Never Used  Substance and Sexual Activity  . Alcohol use: Yes    Comment: occ  . Drug use: No  . Sexual activity: Yes  Lifestyle  . Physical activity    Days per week: Not on file    Minutes per session: Not on file  . Stress: Not on file  Relationships  . Social Musician on phone: Not on file    Gets together: Not on file    Attends religious service: Not on file    Active member of club or organization: Not on file    Attends meetings of clubs or organizations: Not on file    Relationship status: Not on file  . Intimate partner violence    Fear of current or ex partner: Not on file    Emotionally abused: Not on file    Physically abused: Not on file    Forced sexual activity: Not on file  Other  Topics Concern  . Not on file  Social History Narrative   Works [employer] pt access    3 kids daughter 51 y.o 2 sons 42 y.o and 42 y.o as of 42/2020    DPR mother Ahmoni Edge [phone]   Current Meds  Medication Sig  . amLODipine (NORVASC) 2.5 MG tablet Take 1 tablet (2.5 mg total) by mouth daily.  . Cholecalciferol 1.25 MG (50000 UT) capsule Take 1 capsule (50,000 Units total) by mouth once a week. X 6 months then take otc D3 5000 IU daily  . methocarbamol (ROBAXIN) 500 MG tablet Take 500 mg by mouth at bedtime as needed for muscle spasms.   Allergies  Allergen Reactions  . Latex Hives   Recent Results (from the past 2160 hour(s))  CBC with Differential     Status: None   Collection Time: 06/01/19 11:01 PM  Result Value Ref Range   WBC 8.6 4.0 - 10.5 K/uL   RBC 4.26 3.87 - 5.11 MIL/uL   Hemoglobin 12.6 12.0 - 15.0 g/dL   HCT 09.8 11.9 - 14.7 %   MCV 92.7 80.0 - 100.0 fL   MCH 29.6 26.0 - 34.0 pg   MCHC 31.9 30.0 -  36.0 g/dL   RDW 82.9 56.2 - 13.0 %   Platelets 324 150 - 400 K/uL   nRBC 0.0 0.0 - 0.2 %   Neutrophils Relative % 58 %   Neutro Abs 5.1 1.7 - 7.7 K/uL   Lymphocytes Relative 30 %   Lymphs Abs 2.5 0.7 - 4.0 K/uL   Monocytes Relative 8 %   Monocytes Absolute 0.7 0.1 - 1.0 K/uL   Eosinophils Relative 2 %   Eosinophils Absolute 0.2 0.0 - 0.5 K/uL   Basophils Relative 1 %   Basophils Absolute 0.0 0.0 - 0.1 K/uL   Immature Granulocytes 1 %   Abs Immature Granulocytes 0.04 0.00 - 0.07 K/uL    Comment: Performed at [facility], [address] [place], [place] [address]  Comprehensive metabolic panel     Status: Abnormal   Collection Time: 06/01/19 11:01 PM  Result Value Ref Range   Sodium 135 135 - 145 mmol/L   Potassium 3.3 (L) 3.5 - 5.1 mmol/L   Chloride 104 98 - 111 mmol/L   CO2 23 22 - 32 mmol/L   Glucose, Bld 125 (H) 70 - 99 mg/dL   BUN 16 6 - 20 mg/dL   Creatinine, Ser 4.69 0.44 - 1.00 mg/dL   Calcium 9.4 8.9 - 62.9 mg/dL   Total Protein 7.2 6.5 - 8.1 g/dL   Albumin 4.2 3.5 - 5.0 g/dL   AST 19 15 - 41 U/L   ALT 20 0 - 44 U/L   Alkaline Phosphatase 49 38 - 126 U/L   Total Bilirubin 0.6 0.3 - 1.2 mg/dL   GFR calc non Af Amer >60 >60 mL/min   GFR calc Af Amer >60 >60 mL/min   Anion gap 8 5 - 15    Comment: Performed at [facility], [address] [place], [place] [address]  Troponin I (High Sensitivity)     Status: None   Collection Time: 06/01/19 11:01 PM  Result Value Ref Range   Troponin I (High Sensitivity) 3 <18 ng/L    Comment: (NOTE) Elevated high sensitivity troponin I (hsTnI) values and significant  changes across serial measurements may suggest ACS but many other  chronic and acute conditions are known to elevate hsTnI results.  Refer  to the "Links" section for chest pain algorithms and additional  guidance. Performed at [facility], [address] [place], [place] [address]   Iron Binding Cap (TIBC)([facility])      Status: None   Collection Time: 06/07/19  9:37 AM  Result Value Ref Range   Iron 58 28 - 170 ug/dL   TIBC 604 540 - 981 ug/dL   Saturation Ratios 15 10.4 - 31.8 %   UIBC 339 ug/dL    Comment: Performed at [facility], [address] [place], [place] [address]  Ferritin     Status: Abnormal   Collection Time: 06/07/19  9:37 AM  Result Value Ref Range   Ferritin 9 (L) 11 - 307 ng/mL    Comment: Performed at [facility], [address], [place], [place] [address]  Vitamin D (25 hydroxy)     Status: Abnormal   Collection Time: 06/07/19  9:37 AM  Result Value Ref Range   Vit D, 25-Hydroxy 13.4 (L) 30.0 - 100.0 ng/mL    Comment: (NOTE) Vitamin D deficiency has been defined by the Institute of Medicine and an Endocrine Society practice guideline as a level of serum 25-OH vitamin D less than 20 ng/mL (1,2). The Endocrine Society went on to further define vitamin D insufficiency as a level between 21 and 29 ng/mL (2). 1. IOM (Institute of Medicine). 2010. Dietary reference   intakes for calcium and D. [place]: The   [employer]. 2. Holick MF, [place], Bischoff-Ferrari HA, et al.   Evaluation, treatment, and prevention of vitamin D   deficiency: an Endocrine Society clinical practice   guideline. JCEM. 2011 Jul; 96(7):1911-30. Performed At: [facility] [address] [place], [place] [id number] Jolene Schimke MD IO:[id number]   Magnesium     Status: None   Collection Time: 06/07/19  9:37 AM  Result Value Ref Range   Magnesium 1.9 1.7 - 2.4 mg/dL    Comment: Performed at [facility], [address] [place], [place] [address]  Basic Metabolic Panel (BMET)     Status: Abnormal   Collection Time: 06/07/19  9:37 AM  Result Value Ref Range   Sodium 136 135 - 145 mmol/L   Potassium 4.0 3.5 - 5.1 mmol/L   Chloride 104 98 - 111 mmol/L   CO2 23 22 - 32 mmol/L   Glucose, Bld 108 (H) 70 - 99 mg/dL   BUN 10 6 - 20 mg/dL    Creatinine, Ser 0.27 0.44 - 1.00 mg/dL   Calcium 8.9 8.9 - 25.3 mg/dL   GFR calc non Af Amer >60 >60 mL/min   GFR calc Af Amer >60 >60 mL/min   Anion gap 9 5 - 15    Comment: Performed at [facility], [address] [place], [place] [address]  TSH     Status: None   Collection Time: 06/07/19  9:37 AM  Result Value Ref Range   TSH 3.748 0.350 - 4.500 uIU/mL    Comment: Performed by a 3rd Generation assay with a functional sensitivity of <=0.01 uIU/mL. Performed at [facility], [address] [place], [place] [address]   Lipid panel     Status: Abnormal   Collection Time: 06/07/19  9:37 AM  Result Value Ref Range   Cholesterol 186 0 - 200 mg/dL   Triglycerides 57 <595 mg/dL   HDL 70 >63 mg/dL   Total CHOL/HDL Ratio 2.7 RATIO   VLDL 11 0 - 40 mg/dL  LDL Cholesterol 105 (H) 0 - 99 mg/dL    Comment:        Total Cholesterol/HDL:CHD Risk Coronary Heart Disease Risk Table                     Men   Women  1/2 Average Risk   3.4   3.3  Average Risk       5.0   4.4  2 X Average Risk   9.6   7.1  3 X Average Risk  23.4   11.0        Use the calculated Patient Ratio above and the CHD Risk Table to determine the patient's CHD Risk.        ATP III CLASSIFICATION (LDL):  <100     mg/dL   Optimal  [id number]  mg/dL   Near or Above                    Optimal  130-159  mg/dL   Borderline  [phone]  mg/dL   High  >956>190     mg/dL   Very High Performed at [facility], [address] [place], [place] [address]   Hemoglobin A1c     Status: None   Collection Time: 06/07/19  9:37 AM  Result Value Ref Range   Hgb A1c MFr Bld 5.3 4.8 - 5.6 %    Comment: (NOTE) Pre diabetes:          5.7%-6.4% Diabetes:              >6.4% Glycemic control for   <7.0% adults with diabetes    Mean Plasma Glucose 105.41 mg/dL    Comment: Performed at [facility], [address] [place], [place] [address]   Objective  Body mass index is 36.11 kg/m. Wt Readings from  Last 3 Encounters:  08/12/19 217 lb (98.4 kg)  06/05/19 218 lb (98.9 kg)  06/01/19 215 lb (97.5 kg)   Temp Readings from Last 3 Encounters:  08/12/19 97.9 F (36.6 C) (Oral)  06/02/19 97.9 F (36.6 C)  11/06/15 98.3 F (36.8 C) (Oral)   BP Readings from Last 3 Encounters:  08/12/19 108/78  06/02/19 (!) 136/92  11/06/15 (!) 143/87   Pulse Readings from Last 3 Encounters:  08/12/19 75  06/02/19 67  11/06/15 99    Physical Exam Vitals signs and nursing note reviewed.  Constitutional:      Appearance: Normal appearance. She is well-developed and well-groomed. She is obese.  HENT:     Head: Normocephalic and atraumatic.  Eyes:     Conjunctiva/sclera: Conjunctivae normal.     Pupils: Pupils are equal, round, and reactive to light.  Cardiovascular:     Rate and Rhythm: Normal rate and regular rhythm.     Heart sounds: Normal heart sounds. No murmur.  Pulmonary:     Effort: Pulmonary effort is normal.     Breath sounds: Normal breath sounds.  Abdominal:     General: Abdomen is flat. Bowel sounds are normal.     Tenderness: There is no abdominal tenderness.  Musculoskeletal:     Cervical back: She exhibits decreased range of motion and tenderness.     Comments: Pain with rom neck extension/flexion and looking left    Skin:    General: Skin is warm and dry.  Neurological:     General: No focal deficit present.     Mental Status: She is alert and oriented to  person, place, and time. Mental status is at baseline.     Gait: Gait normal.  Psychiatric:        Attention and Perception: Attention and perception normal.        Mood and Affect: Mood and affect normal.        Speech: Speech normal.        Behavior: Behavior normal. Behavior is cooperative.        Thought Content: Thought content normal.        Cognition and Memory: Cognition and memory normal.        Judgment: Judgment normal.     Assessment  Plan  Cervicalgia with numbness/tingling - Plan: Ambulatory  referral to Orthopedic Surgery [facility] consider NCS/eMG upper ext  -consider PT  Consider MRI cervical in future disc with radiology will defer to ortho  Chronic pain of right knee - Plan: Ambulatory referral to Orthopedic Surgery  Obesity (BMI 35.0-39.9 without comorbidity) -rec healthy diet and exercise   Essential hypertension  -cont meds   HM Flu shot 07/17/19 Tdap ? Year  Referred mammo sch 08/27/19 [facility] Dr. Cherylann Banas pt ok with obtain records h/o abnormal pap per pt had cervical procedure on cervix ? leep or cone bx -signed release again today  Declines STD check  rec healthy diet and exercise   Provider: Dr. Olivia Mackie McLean-Scocuzza-Internal Medicine

## 2019-08-12 NOTE — Patient Instructions (Addendum)
The next 56 days 100- 140 dollars 2 months nutrition therapy  Exercise 30 minutes most days of the week   Debrox ear wax drops   Multivitamin with iron or ferrous sulfate 325 mg daily  Garden of life vitamins organic  Vitufusion, Nature made   Add robaxin 500 mg at night   Consider massage/hot stone massage  Or cold packs for 20 minutes at at time Bengay/aspercream  salonpas  Tiger balm   Heating pad   Almond milk  Oat milk   [place] low fat vanilla add berries  Oatmeal steel cut  [employer] plant based avocado or olive olive   Cooking with olive oil    Olive  Oil based based mayo  More fruits and veggies   Neck Exercises Ask your health care provider which exercises are safe for you. Do exercises exactly as told by your health care provider and adjust them as directed. It is normal to feel mild stretching, pulling, tightness, or discomfort as you do these exercises. Stop right away if you feel sudden pain or your pain gets worse. Do not begin these exercises until told by your health care provider. Neck exercises can be important for many reasons. They can improve strength and maintain flexibility in your neck, which will help your upper back and prevent neck pain. Stretching exercises Rotation neck stretching  1. Sit in a chair or stand up. 2. Place your feet flat on the floor, shoulder width apart. 3. Slowly turn your head (rotate) to the right until a slight stretch is felt. Turn it all the way to the right so you can look over your right shoulder. Do not tilt or tip your head. 4. Hold this position for 10-30 seconds. 5. Slowly turn your head (rotate) to the left until a slight stretch is felt. Turn it all the way to the left so you can look over your left shoulder. Do not tilt or tip your head. 6. Hold this position for 10-30 seconds. Repeat __________ times. Complete this exercise __________ times a day. Neck retraction 1. Sit in a sturdy chair or stand  up. 2. Look straight ahead. Do not bend your neck. 3. Use your fingers to push your chin backward (retraction). Do not bend your neck for this movement. Continue to face straight ahead. If you are doing the exercise properly, you will feel a slight sensation in your throat and a stretch at the back of your neck. 4. Hold the stretch for 1-2 seconds. Repeat __________ times. Complete this exercise __________ times a day. Strengthening exercises Neck press 1. Lie on your back on a firm bed or on the floor with a pillow under your head. 2. Use your neck muscles to push your head down on the pillow and straighten your spine. 3. Hold the position as well as you can. Keep your head facing up (in a neutral position) and your chin tucked. 4. Slowly count to 5 while holding this position. Repeat __________ times. Complete this exercise __________ times a day. Isometrics These are exercises in which you strengthen the muscles in your neck while keeping your neck still (isometrics). 1. Sit in a supportive chair and place your hand on your forehead. 2. Keep your head and face facing straight ahead. Do not flex or extend your neck while doing isometrics. 3. Push forward with your head and neck while pushing back with your hand. Hold for 10 seconds. 4. Do the sequence again, this time putting your hand against  the back of your head. Use your head and neck to push backward against the hand pressure. 5. Finally, do the same exercise on either side of your head, pushing sideways against the pressure of your hand. Repeat __________ times. Complete this exercise __________ times a day. Prone head lifts 1. Lie face-down (prone position), resting on your elbows so that your chest and upper back are raised. 2. Start with your head facing downward, near your chest. Position your chin either on or near your chest. 3. Slowly lift your head upward. Lift until you are looking straight ahead. Then continue lifting your  head as far back as you can comfortably stretch. 4. Hold your head up for 5 seconds. Then slowly lower it to your starting position. Repeat __________ times. Complete this exercise __________ times a day. Supine head lifts 1. Lie on your back (supine position), bending your knees to point to the ceiling and keeping your feet flat on the floor. 2. Lift your head slowly off the floor, raising your chin toward your chest. 3. Hold for 5 seconds. Repeat __________ times. Complete this exercise __________ times a day. Scapular retraction 1. Stand with your arms at your sides. Look straight ahead. 2. Slowly pull both shoulders (scapulae) backward and downward (retraction) until you feel a stretch between your shoulder blades in your upper back. 3. Hold for 10-30 seconds. 4. Relax and repeat. Repeat __________ times. Complete this exercise __________ times a day. Contact a health care provider if:  Your neck pain or discomfort gets much worse when you do an exercise.  Your neck pain or discomfort does not improve within 2 hours after you exercise. If you have any of these problems, stop exercising right away. Do not do the exercises again unless your health care provider says that you can. Get help right away if:  You develop sudden, severe neck pain. If this happens, stop exercising right away. Do not do the exercises again unless your health care provider says that you can. This information is not intended to replace advice given to you by your health care provider. Make sure you discuss any questions you have with your health care provider. Document Released: 09/12/2015 Document Revised: 07/30/2018 Document Reviewed: 07/30/2018 Elsevier Patient Education  2020 [employer]    Cholesterol Content in Foods Cholesterol is a waxy, fat-like substance that helps to carry fat in the blood. The body needs cholesterol in small amounts, but too much cholesterol can cause damage to the arteries and  heart. Most people should eat less than 200 milligrams (mg) of cholesterol a day. Foods with cholesterol  Cholesterol is found in animal-based foods, such as meat, seafood, and dairy. Generally, low-fat dairy and lean meats have less cholesterol than full-fat dairy and fatty meats. The milligrams of cholesterol per serving (mg per serving) of common cholesterol-containing foods are listed below. Meat and other proteins  Egg - one large whole egg has 186 mg.  Veal shank - 4 oz has 141 mg.  Lean ground [place] (93% lean) - 4 oz has 118 mg.  Fat-trimmed lamb loin - 4 oz has 106 mg.  Lean ground beef (90% lean) - 4 oz has 100 mg.  Lobster - 3.5 oz has 90 mg.  Pork loin chops - 4 oz has 86 mg.  Canned salmon - 3.5 oz has 83 mg.  Fat-trimmed beef top loin - 4 oz has 78 mg.  Frankfurter - 1 frank (3.5 oz) has 77 mg.  Crab - 3.5  oz has 71 mg.  Roasted chicken without skin, white meat - 4 oz has 66 mg.  Light bologna - 2 oz has 45 mg.  Deli-cut [place] - 2 oz has 31 mg.  Canned tuna - 3.5 oz has 31 mg.  Bacon - 1 oz has 29 mg.  Oysters and mussels (raw) - 3.5 oz has 25 mg.  Mackerel - 1 oz has 22 mg.  Trout - 1 oz has 20 mg.  Pork sausage - 1 link (1 oz) has 17 mg.  Salmon - 1 oz has 16 mg.  Tilapia - 1 oz has 14 mg. Dairy  Soft-serve ice cream -  cup (4 oz) has 103 mg.  Whole-milk yogurt - 1 cup (8 oz) has 29 mg.  Cheddar cheese - 1 oz has 28 mg.  American cheese - 1 oz has 28 mg.  Whole milk - 1 cup (8 oz) has 23 mg.  2% milk - 1 cup (8 oz) has 18 mg.  Cream cheese - 1 tablespoon (Tbsp) has 15 mg.  Cottage cheese -  cup (4 oz) has 14 mg.  Low-fat (1%) milk - 1 cup (8 oz) has 10 mg.  Sour cream - 1 Tbsp has 8.5 mg.  Low-fat yogurt - 1 cup (8 oz) has 8 mg.  Nonfat Greek yogurt - 1 cup (8 oz) has 7 mg.  Half-and-half cream - 1 Tbsp has 5 mg. Fats and oils  Cod liver oil - 1 tablespoon (Tbsp) has 82 mg.  Butter - 1 Tbsp has 15 mg.  Lard - 1 Tbsp  has 14 mg.  Bacon grease - 1 Tbsp has 14 mg.  Mayonnaise - 1 Tbsp has 5-10 mg.  Margarine - 1 Tbsp has 3-10 mg. Exact amounts of cholesterol in these foods may vary depending on specific ingredients and brands. Foods without cholesterol Most plant-based foods do not have cholesterol unless you combine them with a food that has cholesterol. Foods without cholesterol include:  Grains and cereals.  Vegetables.  Fruits.  Vegetable oils, such as olive, canola, and sunflower oil.  Legumes, such as peas, beans, and lentils.  Nuts and seeds.  Egg whites. Summary  The body needs cholesterol in small amounts, but too much cholesterol can cause damage to the arteries and heart.  Most people should eat less than 200 milligrams (mg) of cholesterol a day. This information is not intended to replace advice given to you by your health care provider. Make sure you discuss any questions you have with your health care provider. Document Released: 05/28/2017 Document Revised: 09/13/2017 Document Reviewed: 05/28/2017 Elsevier Patient Education  2020 Elsevier Inc.   cho High Cholesterol  High cholesterol is a condition in which the blood has high levels of a white, waxy, fat-like substance (cholesterol). The human body needs small amounts of cholesterol. The liver makes all the cholesterol that the body needs. Extra (excess) cholesterol comes from the food that we eat. Cholesterol is carried from the liver by the blood through the blood vessels. If you have high cholesterol, deposits (plaques) may build up on the walls of your blood vessels (arteries). Plaques make the arteries narrower and stiffer. Cholesterol plaques increase your risk for heart attack and stroke. Work with your health care provider to keep your cholesterol levels in a healthy range. What increases the risk? This condition is more likely to develop in people who:  Eat foods that are high in animal fat (saturated fat) or  cholesterol.  Are overweight.  Are not getting enough exercise.  Have a family history of high cholesterol. What are the signs or symptoms? There are no symptoms of this condition. How is this diagnosed? This condition may be diagnosed from the results of a blood test.  If you are older than age 78, your health care provider may check your cholesterol every 4-6 years.  You may be checked more often if you already have high cholesterol or other risk factors for heart disease. The blood test for cholesterol measures:  "Bad" cholesterol (LDL cholesterol). This is the main type of cholesterol that causes heart disease. The desired level for LDL is less than 100.  "Good" cholesterol (HDL cholesterol). This type helps to protect against heart disease by cleaning the arteries and carrying the LDL away. The desired level for HDL is 60 or higher.  Triglycerides. These are fats that the body can store or burn for energy. The desired number for triglycerides is lower than 150.  Total cholesterol. This is a measure of the total amount of cholesterol in your blood, including LDL cholesterol, HDL cholesterol, and triglycerides. A healthy number is less than 200. How is this treated? This condition is treated with diet changes, lifestyle changes, and medicines. Diet changes  This may include eating more whole grains, fruits, vegetables, nuts, and fish.  This may also include cutting back on red meat and foods that have a lot of added sugar. Lifestyle changes  Changes may include getting at least 40 minutes of aerobic exercise 3 times a week. Aerobic exercises include walking, biking, and swimming. Aerobic exercise along with a healthy diet can help you maintain a healthy weight.  Changes may also include quitting smoking. Medicines  Medicines are usually given if diet and lifestyle changes have failed to reduce your cholesterol to healthy levels.  Your health care provider may prescribe a  statin medicine. Statin medicines have been shown to reduce cholesterol, which can reduce the risk of heart disease. Follow these instructions at home: Eating and drinking If told by your health care provider:  Eat chicken (without skin), fish, veal, shellfish, ground [place] breast, and round or loin cuts of red meat.  Do not eat fried foods or fatty meats, such as hot dogs and salami.  Eat plenty of fruits, such as apples.  Eat plenty of vegetables, such as broccoli, potatoes, and carrots.  Eat beans, peas, and lentils.  Eat grains such as barley, rice, couscous, and bulgur wheat.  Eat pasta without cream sauces.  Use skim or nonfat milk, and eat low-fat or nonfat yogurt and cheeses.  Do not eat or drink whole milk, cream, ice cream, egg yolks, or hard cheeses.  Do not eat stick margarine or tub margarines that contain trans fats (also called partially hydrogenated oils).  Do not eat saturated tropical oils, such as coconut oil and palm oil.  Do not eat cakes, cookies, crackers, or other baked goods that contain trans fats.  General instructions  Exercise as directed by your health care provider. Increase your activity level with activities such as gardening, walking, and taking the stairs.  Take over-the-counter and prescription medicines only as told by your health care provider.  Do not use any products that contain nicotine or tobacco, such as cigarettes and e-cigarettes. If you need help quitting, ask your health care provider.  Keep all follow-up visits as told by your health care provider. This is important. Contact a health care provider if:  You are struggling to maintain a  healthy diet or weight.  You need help to start on an exercise program.  You need help to stop smoking. Get help right away if:  You have chest pain.  You have trouble breathing. This information is not intended to replace advice given to you by your health care provider. Make sure you  discuss any questions you have with your health care provider. Document Released: 10/01/2005 Document Revised: 10/04/2017 Document Reviewed: 03/31/2016 Elsevier Patient Education  2020 [employer].

## 2019-08-13 DIAGNOSIS — M25561 Pain in right knee: Secondary | ICD-10-CM | POA: Insufficient documentation

## 2019-08-13 DIAGNOSIS — M542 Cervicalgia: Secondary | ICD-10-CM | POA: Insufficient documentation

## 2019-08-13 DIAGNOSIS — G8929 Other chronic pain: Secondary | ICD-10-CM | POA: Insufficient documentation

## 2019-08-13 NOTE — Progress Notes (Signed)
Patient has been informed. She will await the call for ortho appointment.

## 2019-08-24 ENCOUNTER — Encounter: Payer: Self-pay | Admitting: Internal Medicine

## 2019-08-27 ENCOUNTER — Ambulatory Visit
Admission: RE | Admit: 2019-08-27 | Discharge: 2019-08-27 | Disposition: A | Discharge status: Home / Self Care | Payer: 59 | Source: Ambulatory Visit | Attending: Internal Medicine | Admitting: Internal Medicine

## 2019-08-27 DIAGNOSIS — Z1231 Encounter for screening mammogram for malignant neoplasm of breast: Secondary | ICD-10-CM | POA: Insufficient documentation

## 2019-08-28 ENCOUNTER — Other Ambulatory Visit: Payer: Self-pay | Admitting: Internal Medicine

## 2019-08-28 DIAGNOSIS — R928 Other abnormal and inconclusive findings on diagnostic imaging of breast: Secondary | ICD-10-CM

## 2019-09-04 ENCOUNTER — Ambulatory Visit
Admission: RE | Admit: 2019-09-04 | Discharge: 2019-09-04 | Disposition: A | Discharge status: Home / Self Care | Payer: 59 | Source: Ambulatory Visit | Attending: Internal Medicine | Admitting: Internal Medicine

## 2019-09-04 DIAGNOSIS — R928 Other abnormal and inconclusive findings on diagnostic imaging of breast: Secondary | ICD-10-CM | POA: Diagnosis not present

## 2019-09-04 DIAGNOSIS — N6002 Solitary cyst of left breast: Secondary | ICD-10-CM | POA: Diagnosis not present

## 2019-11-13 ENCOUNTER — Ambulatory Visit (INDEPENDENT_AMBULATORY_CARE_PROVIDER_SITE_OTHER): Payer: 59 | Admitting: Internal Medicine

## 2019-11-13 ENCOUNTER — Other Ambulatory Visit: Payer: Self-pay

## 2019-11-13 ENCOUNTER — Encounter: Payer: Self-pay | Admitting: Internal Medicine

## 2019-11-13 VITALS — Ht 65.0 in | Wt 221.0 lb

## 2019-11-13 DIAGNOSIS — G4700 Insomnia, unspecified: Secondary | ICD-10-CM

## 2019-11-13 DIAGNOSIS — R519 Headache, unspecified: Secondary | ICD-10-CM | POA: Diagnosis not present

## 2019-11-13 DIAGNOSIS — M542 Cervicalgia: Secondary | ICD-10-CM | POA: Diagnosis not present

## 2019-11-13 DIAGNOSIS — I10 Essential (primary) hypertension: Secondary | ICD-10-CM | POA: Diagnosis not present

## 2019-11-13 NOTE — Progress Notes (Signed)
Virtual Visit via Video Note  I connected with Cassandra Cardenas   on 11/13/19 at  3:50 PM EST by a video enabled telemedicine application and verified that I am speaking with the correct person using two identifiers.  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 The patient expressed understanding and agreed to proceed.   HPI: 1. HTN on norvasc 2.5 mg qd and BP 120-130s/<80s  2. Intermittent h/a last in 2020 with seeing spots tried Tylenol and helped h/a was at the top her her head and she tried a cool washcloth and in the dark. She was nauseated when she got the h/a as her mouth was watery. She is wondering if she could be having migraines or stress h/a we also disc menstrual migraines. She has not had her vision checked in a while and wonders if h/a could be due to looking at computer screen 3. Neck pain tried 1x massage at [facility] too early to tell but possibly helped  4. Insomnia waking up between 3 am to 5 am at times she is not sure if due to her daughter living out of the house and she lives there with a cat. Mood/anxiety ok. She does not think she is excessively worried about daughter living out of the home   5. Vitamin D def   ROS: See pertinent positives and negatives per HPI.  Past Medical History:  Diagnosis Date  . Abnormal Pap smear of cervix    lsil 01/06/13 no HPV testing done s/p cervical bx 01/15/13 negative +HPV/chronic cervicitis; pap 04/07/13 lsil HPV + f/u 09/17/13 negative; pap 01/14/14 Lsil HPV +; 08/26/14 negative transformation zone absent; pap 01/19/15 negative absent zone; pap 02/08/16 negative; pap 02/19/17 negative; pap 02/27/18 negative [name]   . GERD (gastroesophageal reflux disease)   . Headache   . HTN (hypertension)     Past Surgical History:  Procedure  Laterality Date  . KNEE SURGERY     left knee arthroscopy 44 y.o   . TUBAL LIGATION    . WISDOM TOOTH EXTRACTION      Family History  Problem Relation Age of Onset  . Hypertension Mother   . Cancer Father        ? type mets  . Multiple sclerosis Sister   . Cancer Brother        pancreatitic age 73  . Breast cancer Neg Hx    SOCIAL HX:  Works [employer] pt access  3 kids daughter 75 y.o 2 sons 5 y.o and 64 y.o as of 05/2019  -daughter lives outside of the home  1 cat at home DPR mother Cassandra Cardenas [phone]  Current Outpatient Medications:  .  amLODipine (NORVASC) 2.5 MG tablet, Take 1 tablet (2.5 mg total) by mouth daily., Disp: 90 tablet, Rfl: 3 .  Cholecalciferol 1.25 MG (50000 UT) capsule, Take 1 capsule (50,000 Units total) by mouth once a week. X 6 months then take otc D3 5000 IU daily, Disp: 13 capsule, Rfl: 1 .  methocarbamol (ROBAXIN) 500 MG tablet, Take 500 mg by mouth at bedtime as needed for muscle spasms., Disp: , Rfl:   EXAM:  VITALS per patient if applicable:  GENERAL: alert, oriented, appears well and in no acute distress  HEENT: atraumatic, conjunttiva clear, no obvious abnormalities on inspection of external nose and ears  NECK: normal movements of the head and neck  LUNGS: on inspection no signs of respiratory distress, breathing rate appears normal, no obvious gross SOB, gasping or wheezing  CV: no obvious cyanosis  MS: moves all visible extremities without noticeable abnormality  PSYCH/NEURO: pleasant and cooperative, no obvious depression or anxiety, speech and thought processing grossly intact  ASSESSMENT AND PLAN:  Discussed the following assessment and plan:  Essential hypertension Monitor BP cont norvasc 2.5 mg qd   Nonintractable headache, unspecified chronicity pattern, unspecified headache type -consider execedrin otc  -rec get eyes checked with eye doctor  Prn Tylenol  If this doesn't work consider fiorocet/imitrex Rx    Cervicalgia -cont massage -neck exercises heat, voltaren gel   Insomnia Nature made melatonin 3-6 mg at night  OR  Stress relax brand Tranquil sleep from [employer] Vitamin D 3 5000 daily    HM Flu shot 07/17/19 Tdap ? Year  ? If will get covid 19 vx   mammo sch 08/27/19 had repeat US 09/04/19 1.8 x 1.4 x 1.8 left cyst repeat in 1 year   [facility]/o abnormal pap per pt had cervical procedure on cervix ? leep or cone bx -pap 02/27/18 normal   Declines STD check  rec healthy diet and exercise  -we discussed possible serious and likely etiologies, options for evaluation and workup, limitations of telemedicine visit vs in person visit, treatment, treatment risks and precautions. Pt prefers to treat via telemedicine empirically rather then risking or undertaking an in person visit at this moment. Patient agrees to seek prompt in person care if worsening, new symptoms arise, or if is not improving with treatment.   I discussed the assessment and treatment plan with the patient. The patient was provided an opportunity to ask questions and all were answered. The patient agreed with the plan and demonstrated an understanding of the instructions.   The patient was advised to call back or seek an in-person evaluation if the symptoms worsen or if the condition fails to improve as anticipated.  Time spent 20 minutes  Delorise Jackson, MD

## 2019-11-13 NOTE — Patient Instructions (Addendum)
Nature made melatonin 3-6 mg at night  OR  Stress relax brand Tranquil sleep from [employer] Vitamin D 3 5000 daily   Use voltaren gel over the counter to your neck    Neck Exercises Ask your health care provider which exercises are safe for you. Do exercises exactly as told by your health care provider and adjust them as directed. It is normal to feel mild stretching, pulling, tightness, or discomfort as you do these exercises. Stop right away if you feel sudden pain or your pain gets worse. Do not begin these exercises until told by your health care provider. Neck exercises can be important for many reasons. They can improve strength and maintain flexibility in your neck, which will help your upper back and prevent neck pain. Stretching exercises Rotation neck stretching  1. Sit in a chair or stand up. 2. Place your feet flat on the floor, shoulder width apart. 3. Slowly turn your head (rotate) to the right until a slight stretch is felt. Turn it all the way to the right so you can look over your right shoulder. Do not tilt or tip your head. 4. Hold this position for 10-30 seconds. 5. Slowly turn your head (rotate) to the left until a slight stretch is felt. Turn it all the way to the left so you can look over your left shoulder. Do not tilt or tip your head. 6. Hold this position for 10-30 seconds. Repeat __________ times. Complete this exercise __________ times a day. Neck retraction 1. Sit in a sturdy chair or stand up. 2. Look straight ahead. Do not bend your neck. 3. Use your fingers to push your chin backward (retraction). Do not bend your neck for this movement. Continue to face straight ahead. If you are doing the exercise properly, you will feel a slight sensation in your throat and a stretch at the back of your neck. 4. Hold the stretch for 1-2 seconds. Repeat __________ times. Complete this exercise __________ times a day. Strengthening exercises Neck press 1. Lie on  your back on a firm bed or on the floor with a pillow under your head. 2. Use your neck muscles to push your head down on the pillow and straighten your spine. 3. Hold the position as well as you can. Keep your head facing up (in a neutral position) and your chin tucked. 4. Slowly count to 5 while holding this position. Repeat __________ times. Complete this exercise __________ times a day. Isometrics These are exercises in which you strengthen the muscles in your neck while keeping your neck still (isometrics). 1. Sit in a supportive chair and place your hand on your forehead. 2. Keep your head and face facing straight ahead. Do not flex or extend your neck while doing isometrics. 3. Push forward with your head and neck while pushing back with your hand. Hold for 10 seconds. 4. Do the sequence again, this time putting your hand against the back of your head. Use your head and neck to push backward against the hand pressure. 5. Finally, do the same exercise on either side of your head, pushing sideways against the pressure of your hand. Repeat __________ times. Complete this exercise __________ times a day. Prone head lifts 1. Lie face-down (prone position), resting on your elbows so that your chest and upper back are raised. 2. Start with your head facing downward, near your chest. Position your chin either on or near your chest. 3. Slowly lift your head upward. Lift  until you are looking straight ahead. Then continue lifting your head as far back as you can comfortably stretch. 4. Hold your head up for 5 seconds. Then slowly lower it to your starting position. Repeat __________ times. Complete this exercise __________ times a day. Supine head lifts 1. Lie on your back (supine position), bending your knees to point to the ceiling and keeping your feet flat on the floor. 2. Lift your head slowly off the floor, raising your chin toward your chest. 3. Hold for 5 seconds. Repeat __________ times.  Complete this exercise __________ times a day. Scapular retraction 1. Stand with your arms at your sides. Look straight ahead. 2. Slowly pull both shoulders (scapulae) backward and downward (retraction) until you feel a stretch between your shoulder blades in your upper back. 3. Hold for 10-30 seconds. 4. Relax and repeat. Repeat __________ times. Complete this exercise __________ times a day. Contact a health care provider if:  Your neck pain or discomfort gets much worse when you do an exercise.  Your neck pain or discomfort does not improve within 2 hours after you exercise. If you have any of these problems, stop exercising right away. Do not do the exercises again unless your health care provider says that you can. Get help right away if:  You develop sudden, severe neck pain. If this happens, stop exercising right away. Do not do the exercises again unless your health care provider says that you can. This information is not intended to replace advice given to you by your health care provider. Make sure you discuss any questions you have with your health care provider. Document Revised: 07/30/2018 Document Reviewed: 07/30/2018 Elsevier Patient Education  2020 [employer]  Insomnia Insomnia is a sleep disorder that makes it difficult to fall asleep or stay asleep. Insomnia can cause fatigue, low energy, difficulty concentrating, mood swings, and poor performance at work or school. There are three different ways to classify insomnia:  Difficulty falling asleep.  Difficulty staying asleep.  Waking up too early in the morning. Any type of insomnia can be long-term (chronic) or short-term (acute). Both are common. Short-term insomnia usually lasts for three months or less. Chronic insomnia occurs at least three times a week for longer than three months. What are the causes? Insomnia may be caused by another condition, situation, or substance, such as:  Anxiety.  Certain  medicines.  Gastroesophageal reflux disease (GERD) or other gastrointestinal conditions.  Asthma or other breathing conditions.  Restless legs syndrome, sleep apnea, or other sleep disorders.  Chronic pain.  Menopause.  Stroke.  Abuse of alcohol, tobacco, or illegal drugs.  Mental health conditions, such as depression.  Caffeine.  Neurological disorders, such as Alzheimer's disease.  An overactive thyroid (hyperthyroidism). Sometimes, the cause of insomnia may not be known. What increases the risk? Risk factors for insomnia include:  Gender. Women are affected more often than men.  Age. Insomnia is more common as you get older.  Stress.  Lack of exercise.  Irregular work schedule or working night shifts.  Traveling between different time zones.  Certain medical and mental health conditions. What are the signs or symptoms? If you have insomnia, the main symptom is having trouble falling asleep or having trouble staying asleep. This may lead to other symptoms, such as:  Feeling fatigued or having low energy.  Feeling nervous about going to sleep.  Not feeling rested in the morning.  Having trouble concentrating.  Feeling irritable, anxious, or depressed. How is this  diagnosed? This condition may be diagnosed based on:  Your symptoms and medical history. Your health care provider may ask about: ? Your sleep habits. ? Any medical conditions you have. ? Your mental health.  A physical exam. How is this treated? Treatment for insomnia depends on the cause. Treatment may focus on treating an underlying condition that is causing insomnia. Treatment may also include:  Medicines to help you sleep.  Counseling or therapy.  Lifestyle adjustments to help you sleep better. Follow these instructions at home: Eating and drinking   Limit or avoid alcohol, caffeinated beverages, and cigarettes, especially close to bedtime. These can disrupt your sleep.  Do not  eat a large meal or eat spicy foods right before bedtime. This can lead to digestive discomfort that can make it hard for you to sleep. Sleep habits   Keep a sleep diary to help you and your health care provider figure out what could be causing your insomnia. Write down: ? When you sleep. ? When you wake up during the night. ? How well you sleep. ? How rested you feel the next day. ? Any side effects of medicines you are taking. ? What you eat and drink.  Make your bedroom a dark, comfortable place where it is easy to fall asleep. ? Put up shades or blackout curtains to block light from outside. ? Use a white noise machine to block noise. ? Keep the temperature cool.  Limit screen use before bedtime. This includes: ? Watching TV. ? Using your smartphone, tablet, or computer.  Stick to a routine that includes going to bed and waking up at the same times every day and night. This can help you fall asleep faster. Consider making a quiet activity, such as reading, part of your nighttime routine.  Try to avoid taking naps during the day so that you sleep better at night.  Get out of bed if you are still awake after 15 minutes of trying to sleep. Keep the lights down, but try reading or doing a quiet activity. When you feel sleepy, go back to bed. General instructions  Take over-the-counter and prescription medicines only as told by your health care provider.  Exercise regularly, as told by your health care provider. Avoid exercise starting several hours before bedtime.  Use relaxation techniques to manage stress. Ask your health care provider to suggest some techniques that may work well for you. These may include: ? Breathing exercises. ? Routines to release muscle tension. ? Visualizing peaceful scenes.  Make sure that you drive carefully. Avoid driving if you feel very sleepy.  Keep all follow-up visits as told by your health care provider. This is important. Contact a health  care provider if:  You are tired throughout the day.  You have trouble in your daily routine due to sleepiness.  You continue to have sleep problems, or your sleep problems get worse. Get help right away if:  You have serious thoughts about hurting yourself or someone else. If you ever feel like you may hurt yourself or others, or have thoughts about taking your own life, get help right away. You can go to your nearest emergency department or call:  Your local emergency services (911 in the [place]  A suicide crisis helpline, such as the [facility] at 940-470-5455. This is open 24 hours a day. Summary  Insomnia is a sleep disorder that makes it difficult to fall asleep or stay asleep.  Insomnia can be long-term (chronic)  or short-term (acute).  Treatment for insomnia depends on the cause. Treatment may focus on treating an underlying condition that is causing insomnia.  Keep a sleep diary to help you and your health care provider figure out what could be causing your insomnia. This information is not intended to replace advice given to you by your health care provider. Make sure you discuss any questions you have with your health care provider. Document Revised: 09/13/2017 Document Reviewed: 07/11/2017 Elsevier Patient Education  2020 [employer].

## 2020-02-09 ENCOUNTER — Emergency Department: Payer: 59

## 2020-02-09 ENCOUNTER — Other Ambulatory Visit: Payer: Self-pay

## 2020-02-09 ENCOUNTER — Encounter: Payer: Self-pay | Admitting: Emergency Medicine

## 2020-02-09 ENCOUNTER — Emergency Department
Admission: EM | Admit: 2020-02-09 | Discharge: 2020-02-09 | Disposition: A | Discharge status: Home / Self Care | Payer: 59 | Attending: Emergency Medicine | Admitting: Emergency Medicine

## 2020-02-09 DIAGNOSIS — Z87891 Personal history of nicotine dependence: Secondary | ICD-10-CM | POA: Insufficient documentation

## 2020-02-09 DIAGNOSIS — R1013 Epigastric pain: Secondary | ICD-10-CM

## 2020-02-09 DIAGNOSIS — N390 Urinary tract infection, site not specified: Secondary | ICD-10-CM | POA: Diagnosis not present

## 2020-02-09 DIAGNOSIS — R1111 Vomiting without nausea: Secondary | ICD-10-CM | POA: Diagnosis not present

## 2020-02-09 DIAGNOSIS — I10 Essential (primary) hypertension: Secondary | ICD-10-CM | POA: Diagnosis not present

## 2020-02-09 DIAGNOSIS — Z91040 Latex allergy status: Secondary | ICD-10-CM | POA: Insufficient documentation

## 2020-02-09 DIAGNOSIS — Z79899 Other long term (current) drug therapy: Secondary | ICD-10-CM | POA: Insufficient documentation

## 2020-02-09 LAB — URINALYSIS, COMPLETE (UACMP) WITH MICROSCOPIC
Bilirubin Urine: NEGATIVE
Glucose, UA: NEGATIVE mg/dL
Hgb urine dipstick: NEGATIVE
Ketones, ur: NEGATIVE mg/dL
Nitrite: NEGATIVE
Protein, ur: NEGATIVE mg/dL
Specific Gravity, Urine: 1.016 (ref 1.005–1.030)
pH: 6 (ref 5.0–8.0)

## 2020-02-09 LAB — COMPREHENSIVE METABOLIC PANEL
ALT: 13 U/L (ref 0–44)
AST: 17 U/L (ref 15–41)
Albumin: 4.1 g/dL (ref 3.5–5.0)
Alkaline Phosphatase: 49 U/L (ref 38–126)
Anion gap: 7 (ref 5–15)
BUN: 11 mg/dL (ref 6–20)
CO2: 22 mmol/L (ref 22–32)
Calcium: 8.4 mg/dL — ABNORMAL LOW (ref 8.9–10.3)
Chloride: 108 mmol/L (ref 98–111)
Creatinine, Ser: 0.95 mg/dL (ref 0.44–1.00)
GFR calc Af Amer: >60 mL/min (ref >60)
GFR calc non Af Amer: >60 mL/min (ref >60)
Glucose, Bld: 123 mg/dL — ABNORMAL HIGH (ref 70–99)
Potassium: 3.4 mmol/L — ABNORMAL LOW (ref 3.5–5.1)
Sodium: 137 mmol/L (ref 135–145)
Total Bilirubin: 0.4 mg/dL (ref 0.3–1.2)
Total Protein: 7.2 g/dL (ref 6.5–8.1)

## 2020-02-09 LAB — TROPONIN I (HIGH SENSITIVITY): Troponin I (High Sensitivity): 3 ng/L (ref <18)

## 2020-02-09 LAB — CBC
HCT: 39.5 % (ref 36.0–46.0)
Hemoglobin: 13.2 g/dL (ref 12.0–15.0)
MCH: 30.2 pg (ref 26.0–34.0)
MCHC: 33.4 g/dL (ref 30.0–36.0)
MCV: 90.4 fL (ref 80.0–100.0)
Platelets: 256 10*3/uL (ref 150–400)
RBC: 4.37 MIL/uL (ref 3.87–5.11)
RDW: 12.6 % (ref 11.5–15.5)
WBC: 7.3 10*3/uL (ref 4.0–10.5)
nRBC: 0 % (ref 0.0–0.2)

## 2020-02-09 LAB — LIPASE, BLOOD: Lipase: 31 U/L (ref 11–51)

## 2020-02-09 MED ORDER — OXYCODONE-ACETAMINOPHEN 5-325 MG PO TABS
2.0000 | ORAL_TABLET | Freq: Once | ORAL | Status: AC
  Administered 2020-02-09: 08:00:00 2 via ORAL
  Filled 2020-02-09: qty 2

## 2020-02-09 MED ORDER — FOSFOMYCIN TROMETHAMINE 3 G PO PACK
3.0000 g | PACK | Freq: Once | ORAL | Status: AC
  Administered 2020-02-09: 3 g via ORAL
  Filled 2020-02-09: qty 3

## 2020-02-09 NOTE — ED Provider Notes (Signed)
[facility] Emergency Department Provider Note  Time seen: 7:55 AM  I have reviewed the triage vital signs and the nursing notes.   HISTORY  Chief Complaint Abdominal Pain   HPI Cassandra Cardenas is a 44 y.o. female with a past medical history of hypertension, gastric reflux, presents to the emergency department for abdominal pain.  According to the patient Friday she developed pain in her right back which she believed was consistent with a kidney stone.  States the patient has moved around at times and is currently located more in the epigastric region.  Patient states nausea vomiting over the weekend as well as intermittent diarrhea.  Denies any fever at any point.  No dysuria.  Largely negative review of systems otherwise.  Past Medical History:  Diagnosis Date  . Abnormal Pap smear of cervix    lsil 01/06/13 no HPV testing done s/p cervical bx 01/15/13 negative +HPV/chronic cervicitis; pap 04/07/13 lsil HPV + f/u 09/17/13 negative; pap 01/14/14 Lsil HPV +; 08/26/14 negative transformation zone absent; pap 01/19/15 negative absent zone; pap 02/08/16 negative; pap 02/19/17 negative; pap 02/27/18 negative [facility]   . GERD (gastroesophageal reflux disease)   . Headache   . HTN (hypertension)     Patient Active Problem List   Diagnosis Date Noted  . Chronic pain of right knee 08/13/2019  . Cervicalgia 08/13/2019  . Obesity (BMI 30-39.9) 06/05/2019  . Annual physical exam 06/05/2019  . Essential hypertension 06/05/2019  . Hypokalemia 06/05/2019    Past Surgical History:  Procedure Laterality Date  . KNEE SURGERY     left knee arthroscopy 44 y.o   . TUBAL LIGATION    . WISDOM TOOTH EXTRACTION      Prior to Admission medications   Medication Sig Start Date End Date Taking? Authorizing Provider  amLODipine (NORVASC) 2.5 MG tablet Take 1 tablet (2.5 mg total) by mouth daily. 06/05/19   McLean-Scocuzza, Nino Glow, MD  Cholecalciferol 1.25 MG (50000 UT) capsule Take  1 capsule (50,000 Units total) by mouth once a week. X 6 months then take otc D3 5000 IU daily 06/09/19   McLean-Scocuzza, Nino Glow, MD  methocarbamol (ROBAXIN) 500 MG tablet Take 500 mg by mouth at bedtime as needed for muscle spasms.    [provider]    Allergies  Allergen Reactions  . Latex Hives    Family History  Problem Relation Age of Onset  . Hypertension Mother   . Cancer Father        ? type mets  . Multiple sclerosis Sister   . Cancer Brother        pancreatitic age 33  . Breast cancer Neg Hx     Social History Social History   Tobacco Use  . Smoking status: Former Research scientist (life sciences)  . Smokeless tobacco: Never Used  Substance Use Topics  . Alcohol use: Yes    Comment: occ  . Drug use: No    Review of Systems Constitutional: Negative for fever Cardiovascular: Negative for chest pain. Respiratory: Negative for shortness of breath. Gastrointestinal: Intermittent abdominal pain, currently with epigastric pain.  Positive for nausea vomiting diarrhea over the weekend. Genitourinary: Negative for urinary compaints Musculoskeletal: Negative for musculoskeletal complaints Neurological: Negative for headache All other ROS negative  ____________________________________________   PHYSICAL EXAM:  VITAL SIGNS: ED Triage Vitals  Enc Vitals Group     BP 02/09/20 0340 132/84     Pulse Rate 02/09/20 0340 86     Resp 02/09/20 0340  16     Temp 02/09/20 0340 98.5 F (36.9 C)     Temp Source 02/09/20 0340 Oral     SpO2 02/09/20 0340 98 %     Weight 02/09/20 0341 217 lb (98.4 kg)     Height 02/09/20 0341 5\' 5"  (1.651 m)     Head Circumference --      Peak Flow --      Pain Score 02/09/20 0345 8     Pain Loc --      Pain Edu? --      Excl. in GC? --     Constitutional: Alert and oriented. Well appearing and in no distress. Eyes: Normal exam ENT      Head: Normocephalic and atraumatic.      Mouth/Throat: Mucous membranes are moist. Cardiovascular: Normal rate,  regular rhythm.  Respiratory: Normal respiratory effort without tachypnea nor retractions. Breath sounds are clear  Gastrointestinal: Soft, mild epigastric tenderness palpation.  No rebound guarding or distention.  No CVA tenderness. Musculoskeletal: Nontender with normal range of motion in all extremities.  Neurologic:  Normal speech and language. No gross focal neurologic deficits  Skin:  Skin is warm, dry and intact.  Psychiatric: Mood and affect are normal.   ____________________________________________    EKG  EKG viewed and interpreted by myself shows a normal sinus rhythm at 73 bpm with a narrow QRS, normal axis, normal intervals, no concerning ST changes.  ____________________________________________    RADIOLOGY  CT shows right sided nephrolithiasis but no ureteral stones.  ____________________________________________   INITIAL IMPRESSION / ASSESSMENT AND PLAN / ED COURSE  Pertinent labs & imaging results that were available during my care of the patient were reviewed by me and considered in my medical decision making (see chart for details).   Patient presents to the emergency department for intermittent abdominal pain nausea vomiting diarrhea over the weekend.  Patient states initially is right flank pain/back pain but is now progressed to be more epigastric pain.  Reassuringly patient's labs are normal including normal white blood cell count, normal LFTs and normal lipase.  We will obtain a urine sample.  Given the patient's discomfort we will obtain CT imaging of the abdomen/pelvis to further evaluate.  Patient agreeable to plan of care.  Patient's work-up is essentially negative besides right-sided nephrolithiasis.  Urinalysis shows a very slight possible urinary tract infection.  We will dose fosfomycin as a one-time dose of antibiotics in the emergency department.  Patient will follow up with her PCP.  Cassandra Cardenas was evaluated in Emergency Department on  02/09/2020 for the symptoms described in the history of present illness. She was evaluated in the context of the global COVID-19 pandemic, which necessitated consideration that the patient might be at risk for infection with the SARS-CoV-2 virus that causes COVID-19. Institutional protocols and algorithms that pertain to the evaluation of patients at risk for COVID-19 are in a state of rapid change based on information released by regulatory bodies including the CDC and federal and state organizations. These policies and algorithms were followed during the patient's care in the ED.  ____________________________________________   FINAL CLINICAL IMPRESSION(S) / ED DIAGNOSES  Abdominal pain Urinary tract infection   02/11/2020, MD 02/09/20 1040

## 2020-02-09 NOTE — ED Triage Notes (Signed)
Pt presents to ED with stabbing epigastric pain and frequent diarrhea and vomiting since Sunday. Pt states her stool is very light in color.

## 2020-05-02 ENCOUNTER — Other Ambulatory Visit: Payer: Self-pay

## 2020-05-02 ENCOUNTER — Emergency Department: Payer: 59

## 2020-05-02 ENCOUNTER — Telehealth: Payer: Self-pay | Admitting: Internal Medicine

## 2020-05-02 ENCOUNTER — Emergency Department
Admission: EM | Admit: 2020-05-02 | Discharge: 2020-05-02 | Disposition: A | Discharge status: Home / Self Care | Payer: 59 | Attending: Emergency Medicine | Admitting: Emergency Medicine

## 2020-05-02 DIAGNOSIS — I10 Essential (primary) hypertension: Secondary | ICD-10-CM | POA: Diagnosis not present

## 2020-05-02 DIAGNOSIS — Z87891 Personal history of nicotine dependence: Secondary | ICD-10-CM | POA: Diagnosis not present

## 2020-05-02 DIAGNOSIS — Z79899 Other long term (current) drug therapy: Secondary | ICD-10-CM | POA: Insufficient documentation

## 2020-05-02 DIAGNOSIS — Z91040 Latex allergy status: Secondary | ICD-10-CM | POA: Insufficient documentation

## 2020-05-02 DIAGNOSIS — R1010 Upper abdominal pain, unspecified: Secondary | ICD-10-CM | POA: Insufficient documentation

## 2020-05-02 DIAGNOSIS — R112 Nausea with vomiting, unspecified: Secondary | ICD-10-CM | POA: Diagnosis not present

## 2020-05-02 DIAGNOSIS — K219 Gastro-esophageal reflux disease without esophagitis: Secondary | ICD-10-CM | POA: Diagnosis not present

## 2020-05-02 DIAGNOSIS — R1011 Right upper quadrant pain: Secondary | ICD-10-CM | POA: Diagnosis not present

## 2020-05-02 LAB — COMPREHENSIVE METABOLIC PANEL
ALT: 11 U/L (ref 0–44)
AST: 15 U/L (ref 15–41)
Albumin: 4.1 g/dL (ref 3.5–5.0)
Alkaline Phosphatase: 51 U/L (ref 38–126)
Anion gap: 6 (ref 5–15)
BUN: 13 mg/dL (ref 6–20)
CO2: 21 mmol/L — ABNORMAL LOW (ref 22–32)
Calcium: 8.8 mg/dL — ABNORMAL LOW (ref 8.9–10.3)
Chloride: 109 mmol/L (ref 98–111)
Creatinine, Ser: 1.05 mg/dL — ABNORMAL HIGH (ref 0.44–1.00)
GFR calc Af Amer: >60 mL/min (ref >60)
GFR calc non Af Amer: >60 mL/min (ref >60)
Glucose, Bld: 109 mg/dL — ABNORMAL HIGH (ref 70–99)
Potassium: 3.9 mmol/L (ref 3.5–5.1)
Sodium: 136 mmol/L (ref 135–145)
Total Bilirubin: 0.7 mg/dL (ref 0.3–1.2)
Total Protein: 7.2 g/dL (ref 6.5–8.1)

## 2020-05-02 LAB — CBC
HCT: 37.3 % (ref 36.0–46.0)
Hemoglobin: 12.7 g/dL (ref 12.0–15.0)
MCH: 30.4 pg (ref 26.0–34.0)
MCHC: 34 g/dL (ref 30.0–36.0)
MCV: 89.2 fL (ref 80.0–100.0)
Platelets: 292 10*3/uL (ref 150–400)
RBC: 4.18 MIL/uL (ref 3.87–5.11)
RDW: 12.5 % (ref 11.5–15.5)
WBC: 7.7 10*3/uL (ref 4.0–10.5)
nRBC: 0 % (ref 0.0–0.2)

## 2020-05-02 LAB — LIPASE, BLOOD: Lipase: 33 U/L (ref 11–51)

## 2020-05-02 MED ORDER — ONDANSETRON HCL 4 MG PO TABS: 4.0000 mg | ORAL_TABLET | Freq: Every day | ORAL | 0 refills | Status: DC | PRN

## 2020-05-02 MED ORDER — SODIUM CHLORIDE 0.9% FLUSH: 3.0000 mL | Freq: Once | INTRAVENOUS | Status: DC

## 2020-05-02 NOTE — ED Provider Notes (Signed)
[facility] Emergency Department Provider Note  Time seen: 6:17 PM  I have reviewed the triage vital signs and the nursing notes.   HISTORY  Chief Complaint Abdominal Pain   HPI Cassandra Cardenas is a 44 y.o. female with a past medical history of hypertension, gastric reflux, presents to the emergency department for abdominal discomfort nausea vomiting.  According to the patient since last night she has been experiencing a vague upper abdominal discomfort woke up around 3:00 with nausea and vomiting which she states is mostly green color.  Denies any diarrhea.  No fever.  No shortness of breath or chest pain.   Past Medical History:  Diagnosis Date  . Abnormal Pap smear of cervix    lsil 01/06/13 no HPV testing done s/p cervical bx 01/15/13 negative +HPV/chronic cervicitis; pap 04/07/13 lsil HPV + f/u 09/17/13 negative; pap 01/14/14 Lsil HPV +; 08/26/14 negative transformation zone absent; pap 01/19/15 negative absent zone; pap 02/08/16 negative; pap 02/19/17 negative; pap 02/27/18 [name]   . GERD (gastroesophageal reflux disease)   . Headache   . HTN (hypertension)     Patient Active Problem List   Diagnosis Date Noted  . Chronic pain of right knee 08/13/2019  . Cervicalgia 08/13/2019  . Obesity (BMI 30-39.9) 06/05/2019  . Annual physical exam 06/05/2019  . Essential hypertension 06/05/2019  . Hypokalemia 06/05/2019    Past Surgical History:  Procedure Laterality Date  . KNEE SURGERY     left knee arthroscopy 44 y.o   . TUBAL LIGATION    . WISDOM TOOTH EXTRACTION      Prior to Admission medications   Medication Sig Start Date End Date Taking? Authorizing Provider  amLODipine (NORVASC) 2.5 MG tablet Take 1 tablet (2.5 mg total) by mouth daily. 06/05/19   McLean-Scocuzza, Pasty Spillers, MD  Cholecalciferol 1.25 MG (50000 UT) capsule Take 1 capsule (50,000 Units total) by mouth once a week. X 6 months then take otc D3 5000 IU daily 06/09/19    McLean-Scocuzza, Pasty Spillers, MD  methocarbamol (ROBAXIN) 500 MG tablet Take 500 mg by mouth at bedtime as needed for muscle spasms.    [provider]    Allergies  Allergen Reactions  . Latex Hives    Family History  Problem Relation Age of Onset  . Hypertension Mother   . Cancer Father        ? type mets  . Multiple sclerosis Sister   . Cancer Brother        pancreatitic age 45  . Breast cancer Neg Hx     Social History Social History   Tobacco Use  . Smoking status: Former Games developer  . Smokeless tobacco: Never Used  Vaping Use  . Vaping Use: Never used  Substance Use Topics  . Alcohol use: Yes    Comment: occ  . Drug use: No    Review of Systems Constitutional: Negative for fever. Cardiovascular: Negative for chest pain. Respiratory: Negative for shortness of breath. Gastrointestinal: Upper abdominal discomfort.  Positive for nausea vomiting.  Negative for diarrhea. Genitourinary: Negative for urinary compaints Musculoskeletal: Negative for musculoskeletal complaints Neurological: Negative for headache All other ROS negative  ____________________________________________   PHYSICAL EXAM:  VITAL SIGNS: ED Triage Vitals  Enc Vitals Group     BP 05/02/20 1432 130/84     Pulse Rate 05/02/20 1432 79     Resp 05/02/20 1432 18     Temp 05/02/20 1432 98.7 F (37.1 C)  Temp Source 05/02/20 1643 Oral     SpO2 05/02/20 1432 100 %     Weight 05/02/20 1432 221 lb (100.2 kg)     Height 05/02/20 1432 5\' 5"  (1.651 m)     Head Circumference --      Peak Flow --      Pain Score 05/02/20 1432 10     Pain Loc --      Pain Edu? --      Excl. in GC? --    Constitutional: Alert and oriented. Well appearing and in no distress. Eyes: Normal exam ENT      Head: Normocephalic and atraumatic.      Mouth/Throat: Mucous membranes are moist. Cardiovascular: Normal rate, regular rhythm.  Respiratory: Normal respiratory effort without tachypnea nor retractions. Breath  sounds are clear  Gastrointestinal: Soft and nontender. No distention.   Musculoskeletal: Nontender with normal range of motion in all extremities Neurologic:  Normal speech and language. No gross focal neurologic deficits Skin:  Skin is warm, dry and intact.  Psychiatric: Mood and affect are normal.   ____________________________________________   INITIAL IMPRESSION / ASSESSMENT AND PLAN / ED COURSE  Pertinent labs & imaging results that were available during my care of the patient were reviewed by me and considered in my medical decision making (see chart for details).   Patient presents emergency department for upper abdominal discomfort nausea vomiting overnight.  Patient called her doctor they were concerned that it could be a gallbladder issue and they could not see her today so they told her to come to the emergency department.  Here the patient states very mild upper abdominal discomfort, no current nausea.  Patient's lab work is reassuring including LFTs and lipase.  Urine is pending.  We will obtain a right upper quadrant ultrasound to further evaluate.  Patient agreeable to plan of care.  Ultrasound is negative, lab work is reassuring.  Patient does not wish to wait for urinalysis denies any urinary symptoms.  We will treat with Zofran I discussed with the patient avoiding NSAIDs alcohol and spicy foods for the next 2 weeks.  Patient agreeable to plan of care.  Cassandra Cardenas was evaluated in Emergency Department on 05/02/2020 for the symptoms described in the history of present illness. She was evaluated in the context of the global COVID-19 pandemic, which necessitated consideration that the patient might be at risk for infection with the SARS-CoV-2 virus that causes COVID-19. Institutional protocols and algorithms that pertain to the evaluation of patients at risk for COVID-19 are in a state of rapid change based on information released by regulatory bodies including the CDC and  federal and state organizations. These policies and algorithms were followed during the patient's care in the ED.  ____________________________________________   FINAL CLINICAL IMPRESSION(S) / ED DIAGNOSES  Upper abdominal discomfort Nausea vomiting   05/04/2020, MD 05/02/20 [phone]

## 2020-05-02 NOTE — Telephone Encounter (Signed)
Pt called and said that she feels like she is having issues with her Gallbladder and is throwing green up

## 2020-05-02 NOTE — Telephone Encounter (Signed)
Agreeable to ED but would keep f/u appt 05/03/20 with me as well   Dr.TMS

## 2020-05-02 NOTE — Telephone Encounter (Signed)
Patient has pretty severe right upper abdominal pain, she is nauseated and has vomited green slimy bile. Pt feels little relief by bending forward slightly. Painful to lay down. Sx started last night sharp pain in right lower abdomen.taken BP medications only. Patient is also belching constantly.Patient had green beans and mash potatoes. She is feeling very nausea at the moment. Instructed patient to go to ED, which she stated she would comply. Changing appointment with PCP to ED follow up.

## 2020-05-02 NOTE — ED Triage Notes (Signed)
Pt comes via POV from home with c/o abdominal pain. Pt states this started last night and was advised to come here.  Pt states some nausea and vomiting.

## 2020-05-03 ENCOUNTER — Encounter: Payer: Self-pay | Admitting: Internal Medicine

## 2020-05-03 ENCOUNTER — Ambulatory Visit
Admission: RE | Admit: 2020-05-03 | Discharge: 2020-05-03 | Disposition: A | Discharge status: Home / Self Care | Payer: 59 | Source: Ambulatory Visit | Attending: Internal Medicine | Admitting: Internal Medicine

## 2020-05-03 ENCOUNTER — Other Ambulatory Visit: Payer: Self-pay

## 2020-05-03 ENCOUNTER — Ambulatory Visit (INDEPENDENT_AMBULATORY_CARE_PROVIDER_SITE_OTHER): Payer: 59 | Admitting: Internal Medicine

## 2020-05-03 ENCOUNTER — Ambulatory Visit
Admission: RE | Admit: 2020-05-03 | Discharge: 2020-05-03 | Disposition: A | Discharge status: Home / Self Care | Payer: 59 | Attending: Internal Medicine | Admitting: Internal Medicine

## 2020-05-03 VITALS — BP 124/80 | HR 61 | Temp 97.8°F | Ht 65.0 in | Wt 218.6 lb

## 2020-05-03 DIAGNOSIS — E669 Obesity, unspecified: Secondary | ICD-10-CM

## 2020-05-03 DIAGNOSIS — N200 Calculus of kidney: Secondary | ICD-10-CM | POA: Insufficient documentation

## 2020-05-03 DIAGNOSIS — Z1231 Encounter for screening mammogram for malignant neoplasm of breast: Secondary | ICD-10-CM

## 2020-05-03 MED ORDER — OXYCODONE-ACETAMINOPHEN 5-325 MG PO TABS: 1.0000 | ORAL_TABLET | Freq: Two times a day (BID) | ORAL | 0 refills | Status: DC | PRN

## 2020-05-03 MED ORDER — TAMSULOSIN HCL 0.4 MG PO CAPS: 0.4000 mg | ORAL_CAPSULE | Freq: Every day | ORAL | 0 refills | Status: DC

## 2020-05-03 NOTE — Progress Notes (Signed)
Chief Complaint  Patient presents with  . Hospitalization Follow-up  . Flank Pain  . Emesis   ED f/u 05/02/20 for n/v/epigastric and luq/left flank pain with h/o 02/09/20 CT scan +right kidney stones she was vomiting green bile but nothing today but still has reduced po intake. She is also having right sided ab pain at times 6-7/10. She missed work 7/19 and today and return to work note for 7/21 if she is feeling better. Pain on the left side she reports feels dull and sx's started around 3 pm 05/02/20. She tried otc nexium to see if this would help. [place] RUQ ab negative for GB etiology. She does drink etoh but denies in excess   Review of Systems  Constitutional: Negative for weight loss.  Respiratory: Negative for shortness of breath.   Cardiovascular: Negative for chest pain.  Gastrointestinal: Positive for abdominal pain, nausea and vomiting. Negative for constipation and diarrhea.   Past Medical History:  Diagnosis Date  . Abnormal Pap smear of cervix    lsil 01/06/13 no HPV testing done s/p cervical bx 01/15/13 negative +HPV/chronic cervicitis; pap 04/07/13 lsil HPV + f/u 09/17/13 negative; pap 01/14/14 Lsil HPV +; 08/26/14 negative transformation zone absent; pap 01/19/15 negative absent zone; pap 02/08/16 negative; pap 02/19/17 negative; pap 02/27/18 negative [name]   . GERD (gastroesophageal reflux disease)   . Headache   . HTN (hypertension)    Past Surgical History:  Procedure Laterality Date  . KNEE SURGERY     left knee arthroscopy 44 y.o   . TUBAL LIGATION    . WISDOM TOOTH EXTRACTION     Family History  Problem Relation Age of Onset  . Hypertension Mother   . Cancer Father        ? type mets  . Multiple sclerosis Sister   . Cancer Brother        pancreatitic age 72  . Breast cancer Neg Hx    Social History   Socioeconomic History  . Marital status: Single    Spouse name: Not on file  . Number of children: Not on file  . Years of education: Not on file  . Highest  education level: Not on file  Occupational History  . Not on file  Tobacco Use  . Smoking status: Former Games developer  . Smokeless tobacco: Never Used  Vaping Use  . Vaping Use: Never used  Substance and Sexual Activity  . Alcohol use: Yes    Comment: occ  . Drug use: No  . Sexual activity: Yes  Other Topics Concern  . Not on file  Social History Narrative   Works [facility] pt access    3 kids daughter 76 y.o 2 sons 27 y.o and 42 y.o as of 05/2019    -daughter lives outside of the home    1 cat at home   DPR mother Paytience Bures [phone]   Social Determinants of Health   Financial Resource Strain:   . Difficulty of Paying Living Expenses:   Food Insecurity:   . Worried About Programme researcher, broadcasting/film/video in the Last Year:   . Barista in the Last Year:   Transportation Needs:   . Freight forwarder (Medical):   Marland Kitchen Lack of Transportation (Non-Medical):   Physical Activity:   . Days of Exercise per Week:   . Minutes of Exercise per Session:   Stress:   . Feeling of Stress :   Social Connections:   .  Frequency of Communication with Friends and Family:   . Frequency of Social Gatherings with Friends and Family:   . Attends Religious Services:   . Active Member of Clubs or Organizations:   . Attends Banker Meetings:   Marland Kitchen Marital Status:   Intimate Partner Violence:   . Fear of Current or Ex-Partner:   . Emotionally Abused:   Marland Kitchen Physically Abused:   . Sexually Abused:    Current Meds  Medication Sig  . amLODipine (NORVASC) 2.5 MG tablet Take 1 tablet (2.5 mg total) by mouth daily.  . Cholecalciferol 1.25 MG (50000 UT) capsule Take 1 capsule (50,000 Units total) by mouth once a week. X 6 months then take otc D3 5000 IU daily  . methocarbamol (ROBAXIN) 500 MG tablet Take 500 mg by mouth at bedtime as needed for muscle spasms.  . ondansetron (ZOFRAN) 4 MG tablet Take 1 tablet (4 mg total) by mouth daily as needed for nausea or vomiting.   Allergies  Allergen  Reactions  . Latex Hives   Recent Results (from the past 2160 hour(s))  Lipase, blood     Status: None   Collection Time: 02/09/20  3:50 AM  Result Value Ref Range   Lipase 31 11 - 51 U/L    Comment: Performed at [facility], [address] [place], [place] [address]  Comprehensive metabolic panel     Status: Abnormal   Collection Time: 02/09/20  3:50 AM  Result Value Ref Range   Sodium 137 135 - 145 mmol/L   Potassium 3.4 (L) 3.5 - 5.1 mmol/L   Chloride 108 98 - 111 mmol/L   CO2 22 22 - 32 mmol/L   Glucose, Bld 123 (H) 70 - 99 mg/dL    Comment: Glucose reference range applies only to samples taken after fasting for at least 8 hours.   BUN 11 6 - 20 mg/dL   Creatinine, Ser 5.10 0.44 - 1.00 mg/dL   Calcium 8.4 (L) 8.9 - 10.3 mg/dL   Total Protein 7.2 6.5 - 8.1 g/dL   Albumin 4.1 3.5 - 5.0 g/dL   AST 17 15 - 41 U/L   ALT 13 0 - 44 U/L   Alkaline Phosphatase 49 38 - 126 U/L   Total Bilirubin 0.4 0.3 - 1.2 mg/dL   GFR calc non Af Amer >60 >60 mL/min   GFR calc Af Amer >60 >60 mL/min   Anion gap 7 5 - 15    Comment: Performed at [facility], [address] [place], [place] [address]  CBC     Status: None   Collection Time: 02/09/20  3:50 AM  Result Value Ref Range   WBC 7.3 4.0 - 10.5 K/uL   RBC 4.37 3.87 - 5.11 MIL/uL   Hemoglobin 13.2 12.0 - 15.0 g/dL   HCT 77.8 36 - 46 %   MCV 90.4 80.0 - 100.0 fL   MCH 30.2 26.0 - 34.0 pg   MCHC 33.4 30.0 - 36.0 g/dL   RDW 24.2 35.3 - 61.4 %   Platelets 256 150 - 400 K/uL   nRBC 0.0 0.0 - 0.2 %    Comment: Performed at [facility], [address] [place], [place] [address]  Troponin I (High Sensitivity)     Status: None   Collection Time: 02/09/20  3:50 AM  Result Value Ref Range   Troponin I (High Sensitivity) 3 <18 ng/L    Comment: (NOTE) Elevated high sensitivity troponin I (hsTnI)  values and significant  changes across serial measurements may suggest ACS but many other  chronic and acute  conditions are known to elevate hsTnI results.  Refer to the "Links" section for chest pain algorithms and additional  guidance. Performed at [facility], [address] [place], [place] [address]   Urinalysis, Complete w Microscopic     Status: Abnormal   Collection Time: 02/09/20  9:40 AM  Result Value Ref Range   Color, Urine YELLOW (A) YELLOW   APPearance HAZY (A) CLEAR   Specific Gravity, Urine 1.016 1.005 - 1.030   pH 6.0 5.0 - 8.0   Glucose, UA NEGATIVE NEGATIVE mg/dL   Hgb urine dipstick NEGATIVE NEGATIVE   Bilirubin Urine NEGATIVE NEGATIVE   Ketones, ur NEGATIVE NEGATIVE mg/dL   Protein, ur NEGATIVE NEGATIVE mg/dL   Nitrite NEGATIVE NEGATIVE   Leukocytes,Ua TRACE (A) NEGATIVE   RBC / HPF 0-5 0 - 5 RBC/hpf   WBC, UA 0-5 0 - 5 WBC/hpf   Bacteria, UA MANY (A) NONE SEEN   Squamous Epithelial / LPF 0-5 0 - 5   Mucus PRESENT     Comment: Performed at [facility], [address] [place], [place] [address]  Lipase, blood     Status: None   Collection Time: 05/02/20  2:34 PM  Result Value Ref Range   Lipase 33 11 - 51 U/L    Comment: Performed at [facility], [address] [place], [place] [address]  Comprehensive metabolic panel     Status: Abnormal   Collection Time: 05/02/20  2:34 PM  Result Value Ref Range   Sodium 136 135 - 145 mmol/L   Potassium 3.9 3.5 - 5.1 mmol/L   Chloride 109 98 - 111 mmol/L   CO2 21 (L) 22 - 32 mmol/L   Glucose, Bld 109 (H) 70 - 99 mg/dL    Comment: Glucose reference range applies only to samples taken after fasting for at least 8 hours.   BUN 13 6 - 20 mg/dL   Creatinine, Ser 5.05 (H) 0.44 - 1.00 mg/dL   Calcium 8.8 (L) 8.9 - 10.3 mg/dL   Total Protein 7.2 6.5 - 8.1 g/dL   Albumin 4.1 3.5 - 5.0 g/dL   AST 15 15 - 41 U/L   ALT 11 0 - 44 U/L   Alkaline Phosphatase 51 38 - 126 U/L   Total Bilirubin 0.7 0.3 - 1.2 mg/dL   GFR calc non Af Amer >60 >60 mL/min   GFR calc Af Amer >60 >60 mL/min   Anion gap 6  5 - 15    Comment: Performed at [facility], [address] [place], [place] [address]  CBC     Status: None   Collection Time: 05/02/20  2:34 PM  Result Value Ref Range   WBC 7.7 4.0 - 10.5 K/uL   RBC 4.18 3.87 - 5.11 MIL/uL   Hemoglobin 12.7 12.0 - 15.0 g/dL   HCT 80.1 36 - 46 %   MCV 89.2 80.0 - 100.0 fL   MCH 30.4 26.0 - 34.0 pg   MCHC 34.0 30.0 - 36.0 g/dL   RDW 65.5 37.4 - 82.7 %   Platelets 292 150 - 400 K/uL   nRBC 0.0 0.0 - 0.2 %    Comment: Performed at [facility], [address] [place], [place] [address]   Objective  Body mass index is 36.38 kg/m. Wt Readings from Last 3 Encounters:  05/03/20 218 lb 9.6  oz (99.2 kg)  05/02/20 221 lb (100.2 kg)  02/09/20 217 lb (98.4 kg)   Temp Readings from Last 3 Encounters:  05/03/20 97.8 F (36.6 C) (Oral)  05/02/20 98.7 F (37.1 C) (Oral)  02/09/20 98.5 F (36.9 C) (Oral)   BP Readings from Last 3 Encounters:  05/03/20 124/80  05/02/20 140/82  02/09/20 132/84   Pulse Readings from Last 3 Encounters:  05/03/20 61  05/02/20 60  02/09/20 86    Physical Exam Vitals and nursing note reviewed.  Constitutional:      Appearance: Normal appearance. She is well-developed and well-groomed. She is obese.  HENT:     Head: Normocephalic and atraumatic.  Eyes:     Conjunctiva/sclera: Conjunctivae normal.     Pupils: Pupils are equal, round, and reactive to light.  Cardiovascular:     Rate and Rhythm: Normal rate and regular rhythm.     Heart sounds: Normal heart sounds. No murmur heard.   Pulmonary:     Effort: Pulmonary effort is normal.     Breath sounds: Normal breath sounds.  Abdominal:     Tenderness: There is abdominal tenderness in the epigastric area and left upper quadrant. There is left CVA tenderness.  Skin:    General: Skin is warm and dry.  Neurological:     General: No focal deficit present.     Mental Status: She is alert and oriented to person, place, and time. Mental status  is at baseline.     Gait: Gait normal.  Psychiatric:        Attention and Perception: Attention and perception normal.        Mood and Affect: Mood and affect normal.        Speech: Speech normal.        Behavior: Behavior normal. Behavior is cooperative.        Thought Content: Thought content normal.        Cognition and Memory: Cognition and memory normal.        Judgment: Judgment normal.     Assessment  Plan  Epigastric LUQ/left flank ab pain r/o Kidney stones US 05/02/20 neg GS - Plan: tamsulosin (FLOMAX) 0.4 MG CAPS capsule, oxyCODONE-acetaminophen (PERCOCET) 5-325 MG tablet bid prn, DG Abd 1 View KUB Consider urology vs GI in future   Obesity (BMI 30-39.9)  rec healthy diet and exercise   HM Flu shot10/2/20 Tdap ? Year  covid pfizer had 2/2   mammosch 08/27/19 had repeat US 09/04/19 1.8 x 1.4 x 1.8 left cyst repeat in 1 year  -referred   [facility] h/o abnormal papper pt had cervical procedure on cervix ? leep or cone bx -pap 02/27/18 normal   Colonoscopy sch age 44 y.o   Declines STD check  rec healthy diet and exercise Provider: Dr. French Anaracy McLean-Scocuzza-Internal Medicine

## 2020-05-03 NOTE — Progress Notes (Signed)
Patient recently seen in ED for right flank pain, abdominal pain, and vomiting green bile like fluid. States she had labs and imaging done at [facility] hospital.  Patient flagged: Current status:  PATIENT IS OVERDUE FOR BMI FOLLOW UP PLAN BMI is estimated to be 36.4 based on the last recorded weight and height

## 2020-05-03 NOTE — Patient Instructions (Addendum)
Increase lemon water  Continue nexium otc 30 minutes before breakfast or dinner x 1-2 weeks     Bland Diet A bland diet consists of foods that are often soft and do not have a lot of fat, fiber, or extra seasonings. Foods without fat, fiber, or seasoning are easier for the body to digest. They are also less likely to irritate your mouth, throat, stomach, and other parts of your digestive system. A bland diet is sometimes called a BRAT diet. What is my plan? Your health care provider or food and nutrition specialist (dietitian) may recommend specific changes to your diet to prevent symptoms or to treat your symptoms. These changes may include:  Eating small meals often.  Cooking food until it is soft enough to chew easily.  Chewing your food well.  Drinking fluids slowly.  Not eating foods that are very spicy, sour, or fatty.  Not eating citrus fruits, such as oranges and grapefruit. What do I need to know about this diet?  Eat a variety of foods from the bland diet food list.  Do not follow a bland diet longer than needed.  Ask your health care provider whether you should take vitamins or supplements. What foods can I eat? Grains  Hot cereals, such as cream of wheat. Rice. Bread, crackers, or tortillas made from refined white flour. Vegetables Canned or cooked vegetables. Mashed or boiled potatoes. Fruits  Bananas. Applesauce. Other types of cooked or canned fruit with the skin and seeds removed, such as canned peaches or pears. Meats and other proteins  Scrambled eggs. Creamy peanut butter or other nut butters. Lean, well-cooked meats, such as chicken or fish. Tofu. Soups or broths. Dairy Low-fat dairy products, such as milk, cottage cheese, or yogurt. Beverages  Water. Herbal tea. Apple juice. Fats and oils Mild salad dressings. Canola or olive oil. Sweets and desserts Pudding. Custard. Fruit gelatin. Ice cream. The items listed above may not be a complete list of  recommended foods and beverages. Contact a dietitian for more options. What foods are not recommended? Grains Whole grain breads and cereals. Vegetables Raw vegetables. Fruits Raw fruits, especially citrus, berries, or dried fruits. Dairy Whole fat dairy foods. Beverages Caffeinated drinks. Alcohol. Seasonings and condiments Strongly flavored seasonings or condiments. Hot sauce. Salsa. Other foods Spicy foods. Fried foods. Sour foods, such as pickled or fermented foods. Foods with high sugar content. Foods high in fiber. The items listed above may not be a complete list of foods and beverages to avoid. Contact a dietitian for more information. Summary  A bland diet consists of foods that are often soft and do not have a lot of fat, fiber, or extra seasonings.  Foods without fat, fiber, or seasoning are easier for the body to digest.  Check with your health care provider to see how long you should follow this diet plan. It is not meant to be followed for long periods. This information is not intended to replace advice given to you by your health care provider. Make sure you discuss any questions you have with your health care provider. Document Revised: 10/30/2017 Document Reviewed: 10/30/2017 Elsevier Patient Education  2020 [employer].   Food Choices for Gastroesophageal Reflux Disease, Adult When you have gastroesophageal reflux disease (GERD), the foods you eat and your eating habits are very important. Choosing the right foods can help ease the discomfort of GERD. Consider working with a diet and nutrition specialist (dietitian) to help you make healthy food choices. What general guidelines  should I follow?  Eating plan  Choose healthy foods low in fat, such as fruits, vegetables, whole grains, low-fat dairy products, and lean meat, fish, and poultry.  Eat frequent, small meals instead of three large meals each day. Eat your meals slowly, in a relaxed setting. Avoid  bending over or lying down until 2-3 hours after eating.  Limit high-fat foods such as fatty meats or fried foods.  Limit your intake of oils, butter, and shortening to less than 8 teaspoons each day.  Avoid the following: ? Foods that cause symptoms. These may be different for different people. Keep a food diary to keep track of foods that cause symptoms. ? Alcohol. ? Drinking large amounts of liquid with meals. ? Eating meals during the 2-3 hours before bed.  Cook foods using methods other than frying. This may include baking, grilling, or broiling. Lifestyle  Maintain a healthy weight. Ask your health care provider what weight is healthy for you. If you need to lose weight, work with your health care provider to do so safely.  Exercise for at least 30 minutes on 5 or more days each week, or as told by your health care provider.  Avoid wearing clothes that fit tightly around your waist and chest.  Do not use any products that contain nicotine or tobacco, such as cigarettes and e-cigarettes. If you need help quitting, ask your health care provider.  Sleep with the head of your bed raised. Use a wedge under the mattress or blocks under the bed frame to raise the head of the bed. What foods are not recommended? The items listed may not be a complete list. Talk with your dietitian about what dietary choices are best for you. Grains Pastries or quick breads with added fat. [place] toast. Vegetables Deep fried vegetables. [place] fries. Any vegetables prepared with added fat. Any vegetables that cause symptoms. For some people this may include tomatoes and tomato products, chili peppers, onions and garlic, and horseradish. Fruits Any fruits prepared with added fat. Any fruits that cause symptoms. For some people this may include citrus fruits, such as oranges, grapefruit, pineapple, and lemons. Meats and other protein foods High-fat meats, such as fatty beef or pork, hot dogs, ribs, ham,  sausage, salami and bacon. Fried meat or protein, including fried fish and fried chicken. Nuts and nut butters. Dairy Whole milk and chocolate milk. Sour cream. Cream. Ice cream. Cream cheese. Milk shakes. Beverages Coffee and tea, with or without caffeine. Carbonated beverages. Sodas. Energy drinks. Fruit juice made with acidic fruits (such as orange or grapefruit). Tomato juice. Alcoholic drinks. Fats and oils Butter. Margarine. Shortening. Ghee. Sweets and desserts Chocolate and cocoa. Donuts. Seasoning and other foods Pepper. Peppermint and spearmint. Any condiments, herbs, or seasonings that cause symptoms. For some people, this may include curry, hot sauce, or vinegar-based salad dressings. Summary  When you have gastroesophageal reflux disease (GERD), food and lifestyle choices are very important to help ease the discomfort of GERD.  Eat frequent, small meals instead of three large meals each day. Eat your meals slowly, in a relaxed setting. Avoid bending over or lying down until 2-3 hours after eating.  Limit high-fat foods such as fatty meat or fried foods. This information is not intended to replace advice given to you by your health care provider. Make sure you discuss any questions you have with your health care provider. Document Revised: 01/22/2019 Document Reviewed: 10/02/2016 Elsevier Patient Education  2020 Elsevier Inc.  Tamsulosin capsules What  is this medicine? TAMSULOSIN (tam SOO loe sin) is an alpha blocker. It is used to treat the signs and symptoms of an enlarged prostate in men. This condition is also called benign prostatic hyperplasia (BPH). This medicine may be used for other purposes; ask your health care provider or pharmacist if you have questions. COMMON BRAND NAME(S): Flomax What should I tell my health care provider before I take this medicine? They need to know if you have any of the following conditions:  advanced kidney disease  advanced liver  disease  low blood pressure  prostate cancer  an unusual or allergic reaction to tamsulosin, sulfa drugs, other medicines, foods, dyes, or preservatives  pregnant or trying to get pregnant  breast-feeding How should I use this medicine? Take this medicine by mouth about 30 minutes after the same meal every day. Follow the directions on the prescription label. Swallow the capsules whole with a glass of water. Do not crush, chew, or open capsules. Do not take your medicine more often than directed. Do not stop taking your medicine unless your doctor tells you to. Talk to your pediatrician regarding the use of this medicine in children. Special care may be needed. Overdosage: If you think you have taken too much of this medicine contact a poison control center or emergency room at once. NOTE: This medicine is only for you. Do not share this medicine with others. What if I miss a dose? If you miss a dose, take it as soon as you can. If it is almost time for your next dose, take only that dose. Do not take double or extra doses. If you stop taking your medicine for several days or more, ask your doctor or health care professional what dose you should start back on. What may interact with this medicine?  cimetidine  fluoxetine  ketoconazole  medicines for erectile disfunction like sildenafil, tadalafil, vardenafil  medicines for high blood pressure  other alpha-blockers like alfuzosin, doxazosin, phentolamine, phenoxybenzamine, prazosin, terazosin  warfarin This list may not describe all possible interactions. Give your health care provider a list of all the medicines, herbs, non-prescription drugs, or dietary supplements you use. Also tell them if you smoke, drink alcohol, or use illegal drugs. Some items may interact with your medicine. What should I watch for while using this medicine? Visit your doctor or health care professional for regular check ups. You will need lab work done  before you start this medicine and regularly while you are taking it. Check your blood pressure as directed. Ask your health care professional what your blood pressure should be, and when you should contact him or her. This medicine may make you feel dizzy or lightheaded. This is more likely to happen after the first dose, after an increase in dose, or during hot weather or exercise. Drinking alcohol and taking some medicines can make this worse. Do not drive, use machinery, or do anything that needs mental alertness until you know how this medicine affects you. Do not sit or stand up quickly. If you begin to feel dizzy, sit down until you feel better. These effects can decrease once your body adjusts to the medicine. Contact your doctor or health care professional right away if you have an erection that lasts longer than 4 hours or if it becomes painful. This may be a sign of a serious problem and must be treated right away to prevent permanent damage. If you are thinking of having cataract surgery, tell your eye surgeon  that you have taken this medicine. What side effects may I notice from receiving this medicine? Side effects that you should report to your doctor or health care professional as soon as possible:  allergic reactions like skin rash or itching, hives, swelling of the lips, mouth, tongue, or throat  breathing problems  change in vision  feeling faint or lightheaded  irregular heartbeat  prolonged or painful erection  weakness Side effects that usually do not require medical attention (report to your doctor or health care professional if they continue or are bothersome):  back pain  change in sex drive or performance  constipation, nausea or vomiting  cough  drowsy  runny or stuffy nose  trouble sleeping This list may not describe all possible side effects. Call your doctor for medical advice about side effects. You may report side effects to FDA at  1-800-FDA-1088. Where should I keep my medicine? Keep out of the reach of children. Store at room temperature between 15 and 30 degrees C (59 and 86 degrees F). Throw away any unused medicine after the expiration date. NOTE: This sheet is a summary. It may not cover all possible information. If you have questions about this medicine, talk to your doctor, pharmacist, or health care provider.  2020 Elsevier/Gold Standard (2018-03-06 12:54:06)     Dietary Guidelines to Help Prevent Kidney Stones Kidney stones are deposits of minerals and salts that form inside your kidneys. Your risk of developing kidney stones may be greater depending on your diet, your lifestyle, the medicines you take, and whether you have certain medical conditions. Most people can reduce their chances of developing kidney stones by following the instructions below. Depending on your overall health and the type of kidney stones you tend to develop, your dietitian may give you more specific instructions. What are tips for following this plan? Reading food labels  Choose foods with "no salt added" or "low-salt" labels. Limit your sodium intake to less than 1500 mg per day.  Choose foods with calcium for each meal and snack. Try to eat about 300 mg of calcium at each meal. Foods that contain 200-500 mg of calcium per serving include: ? 8 oz (237 ml) of milk, fortified nondairy milk, and fortified fruit juice. ? 8 oz (237 ml) of kefir, yogurt, and soy yogurt. ? 4 oz (118 ml) of tofu. ? 1 oz of cheese. ? 1 cup (300 g) of dried figs. ? 1 cup (91 g) of cooked broccoli. ? 1-3 oz can of sardines or mackerel.  Most people need 1000 to 1500 mg of calcium each day. Talk to your dietitian about how much calcium is recommended for you. Shopping  Buy plenty of fresh fruits and vegetables. Most people do not need to avoid fruits and vegetables, even if they contain nutrients that may contribute to kidney stones.  When shopping for  convenience foods, choose: ? Whole pieces of fruit. ? Premade salads with dressing on the side. ? Low-fat fruit and yogurt smoothies.  Avoid buying frozen meals or prepared deli foods.  Look for foods with live cultures, such as yogurt and kefir. Cooking  Do not add salt to food when cooking. Place a salt shaker on the table and allow each person to add his or her own salt to taste.  Use vegetable protein, such as beans, textured vegetable protein (TVP), or tofu instead of meat in pasta, casseroles, and soups. Meal planning   Eat less salt, if told by your dietitian. To do  this: ? Avoid eating processed or premade food. ? Avoid eating fast food.  Eat less animal protein, including cheese, meat, poultry, or fish, if told by your dietitian. To do this: ? Limit the number of times you have meat, poultry, fish, or cheese each week. Eat a diet free of meat at least 2 days a week. ? Eat only one serving each day of meat, poultry, fish, or seafood. ? When you prepare animal protein, cut pieces into small portion sizes. For most meat and fish, one serving is about the size of one deck of cards.  Eat at least 5 servings of fresh fruits and vegetables each day. To do this: ? Keep fruits and vegetables on hand for snacks. ? Eat 1 piece of fruit or a handful of berries with breakfast. ? Have a salad and fruit at lunch. ? Have two kinds of vegetables at dinner.  Limit foods that are high in a substance called oxalate. These include: ? Spinach. ? Rhubarb. ? Beets. ? Potato chips and french fries. ? Nuts.  If you regularly take a diuretic medicine, make sure to eat at least 1-2 fruits or vegetables high in potassium each day. These include: ? Avocado. ? Banana. ? Orange, prune, carrot, or tomato juice. ? Baked potato. ? Cabbage. ? Beans and split peas. General instructions   Drink enough fluid to keep your urine clear or pale yellow. This is the most important thing you can  do.  Talk to your health care provider and dietitian about taking daily supplements. Depending on your health and the cause of your kidney stones, you may be advised: ? Not to take supplements with vitamin C. ? To take a calcium supplement. ? To take a daily probiotic supplement. ? To take other supplements such as magnesium, fish oil, or vitamin B6.  Take all medicines and supplements as told by your health care provider.  Limit alcohol intake to no more than 1 drink a day for nonpregnant women and 2 drinks a day for men. One drink equals 12 oz of beer, 5 oz of wine, or 1 oz of hard liquor.  Lose weight if told by your health care provider. Work with your dietitian to find strategies and an eating plan that works best for you. What foods are not recommended? Limit your intake of the following foods, or as told by your dietitian. Talk to your dietitian about specific foods you should avoid based on the type of kidney stones and your overall health. Grains Breads. Bagels. Rolls. Baked goods. Salted crackers. Cereal. Pasta. Vegetables Spinach. Rhubarb. Beets. Canned vegetables. Rosita Fire. Olives. Meats and other protein foods Nuts. Nut butters. Large portions of meat, poultry, or fish. Salted or cured meats. Deli meats. Hot dogs. Sausages. Dairy Cheese. Beverages Regular soft drinks. Regular vegetable juice. Seasonings and other foods Seasoning blends with salt. Salad dressings. Canned soups. Soy sauce. Ketchup. Barbecue sauce. Canned pasta sauce. Casseroles. Pizza. Lasagna. Frozen meals. Potato chips. [place] fries. Summary  You can reduce your risk of kidney stones by making changes to your diet.  The most important thing you can do is drink enough fluid. You should drink enough fluid to keep your urine clear or pale yellow.  Ask your health care provider or dietitian how much protein from animal sources you should eat each day, and also how much salt and calcium you should have each  day. This information is not intended to replace advice given to you by your health care provider.  Make sure you discuss any questions you have with your health care provider. Document Revised: 01/21/2019 Document Reviewed: 09/11/2016 Elsevier Patient Education  2020 Elsevier Inc.  Kidney Stones  Kidney stones are solid, rock-like deposits that form inside of the kidneys. The kidneys are a pair of organs that make urine. A kidney stone may form in a kidney and move into other parts of the urinary tract, including the tubes that connect the kidneys to the bladder (ureters), the bladder, and the tube that carries urine out of the body (urethra). As the stone moves through these areas, it can cause intense pain and block the flow of urine. Kidney stones are created when high levels of certain minerals are found in the urine. The stones are usually passed out of the body through urination, but in some cases, medical treatment may be needed to remove them. What are the causes? Kidney stones may be caused by:  A condition in which certain glands produce too much parathyroid hormone (primary hyperparathyroidism), which causes too much calcium buildup in the blood.  A buildup of uric acid crystals in the bladder (hyperuricosuria). Uric acid is a chemical that the body produces when you eat certain foods. It usually exits the body in the urine.  Narrowing (stricture) of one or both of the ureters.  A kidney blockage that is present at birth (congenital obstruction).  Past surgery on the kidney or the ureters, such as gastric bypass surgery. What increases the risk? The following factors may make you more likely to develop this condition:  Having had a kidney stone in the past.  Having a family history of kidney stones.  Not drinking enough water.  Eating a diet that is high in protein, salt (sodium), or sugar.  Being overweight or obese. What are the signs or symptoms? Symptoms of a kidney  stone may include:  Pain in the side of the abdomen, right below the ribs (flank pain). Pain usually spreads (radiates) to the groin.  Needing to urinate frequently or urgently.  Painful urination.  Blood in the urine (hematuria).  Nausea.  Vomiting.  Fever and chills. How is this diagnosed? This condition may be diagnosed based on:  Your symptoms and medical history.  A physical exam.  Blood tests.  Urine tests. These may be done before and after the stone passes out of your body through urination.  Imaging tests, such as a CT scan, abdominal X-ray, or ultrasound.  A procedure to examine the inside of the bladder (cystoscopy). How is this treated? Treatment for kidney stones depends on the size, location, and makeup of the stones. Kidney stones will often pass out of the body through urination. You may need to:  Increase your fluid intake to help pass the stone. In some cases, you may be given fluids through an IV and may need to be monitored at the hospital.  Take medicine for pain.  Make changes in your diet to help prevent kidney stones from coming back. Sometimes, medical procedures are needed to remove a kidney stone. This may involve:  A procedure to break up kidney stones using: ? A focused beam of light (laser therapy). ? Shock waves (extracorporeal shock wave lithotripsy).  Surgery to remove kidney stones. This may be needed if you have severe pain or have stones that block your urinary tract. Follow these instructions at home: Medicines  Take over-the-counter and prescription medicines only as told by your health care provider.  Ask your health care provider if  the medicine prescribed to you requires you to avoid driving or using heavy machinery. Eating and drinking  Drink enough fluid to keep your urine pale yellow. You may be instructed to drink at least 8-10 glasses of water each day. This will help you pass the kidney stone.  If directed, change  your diet. This may include: ? Limiting how much sodium you eat. ? Eating more fruits and vegetables. ? Limiting how much animal protein--such as red meat, poultry, fish, and eggs--you eat.  Follow instructions from your health care provider about eating or drinking restrictions. General instructions  Collect urine samples as told by your health care provider. You may need to collect a urine sample: ? 24 hours after you pass the stone. ? 8-12 weeks after passing the kidney stone, and every 6-12 months after that.  Strain your urine every time you urinate, for as long as directed. Use the strainer that your health care provider recommends.  Do not throw out the kidney stone after passing it. Keep the stone so it can be tested by your health care provider. Testing the makeup of your kidney stone may help prevent you from getting kidney stones in the future.  Keep all follow-up visits as told by your health care provider. This is important. You may need follow-up X-rays or ultrasounds to make sure that your stone has passed. How is this prevented? To prevent another kidney stone:  Drink enough fluid to keep your urine pale yellow. This is the best way to prevent kidney stones.  Eat a healthy diet and follow recommendations from your health care provider about foods to avoid. You may be instructed to eat a low-protein diet. Recommendations vary depending on the type of kidney stone that you have.  Maintain a healthy weight. Where to find more information  National Kidney Foundation (NKF): www.kidney.org  [facility]): www.urologyhealth.org Contact a health care provider if:  You have pain that gets worse or does not get better with medicine. Get help right away if:  You have a fever or chills.  You develop severe pain.  You develop new abdominal pain.  You faint.  You are unable to urinate. Summary  Kidney stones are solid, rock-like deposits that form  inside of the kidneys.  Kidney stones can cause nausea, vomiting, blood in the urine, abdominal pain, and the urge to urinate frequently.  Treatment for kidney stones depends on the size, location, and makeup of the stones. Kidney stones will often pass out of the body through urination.  Kidney stones can be prevented by drinking enough fluids, eating a healthy diet, and maintaining a healthy weight. This information is not intended to replace advice given to you by your health care provider. Make sure you discuss any questions you have with your health care provider. Document Revised: 02/17/2019 Document Reviewed: 02/17/2019 Elsevier Patient Education  2020 [employer].

## 2020-05-04 ENCOUNTER — Encounter: Payer: Self-pay | Admitting: Internal Medicine

## 2020-05-05 ENCOUNTER — Encounter: Payer: Self-pay | Admitting: Internal Medicine

## 2020-06-15 ENCOUNTER — Ambulatory Visit: Payer: 59 | Admitting: Internal Medicine

## 2020-06-22 ENCOUNTER — Other Ambulatory Visit: Payer: Self-pay | Admitting: Internal Medicine

## 2020-06-22 ENCOUNTER — Ambulatory Visit (INDEPENDENT_AMBULATORY_CARE_PROVIDER_SITE_OTHER): Payer: 59

## 2020-06-22 ENCOUNTER — Encounter: Payer: Self-pay | Admitting: Internal Medicine

## 2020-06-22 ENCOUNTER — Other Ambulatory Visit: Payer: Self-pay

## 2020-06-22 ENCOUNTER — Ambulatory Visit (INDEPENDENT_AMBULATORY_CARE_PROVIDER_SITE_OTHER): Payer: 59 | Admitting: Internal Medicine

## 2020-06-22 VITALS — BP 110/80 | HR 72 | Temp 99.2°F | Ht 65.0 in | Wt 224.4 lb

## 2020-06-22 DIAGNOSIS — Z13220 Encounter for screening for lipoid disorders: Secondary | ICD-10-CM

## 2020-06-22 DIAGNOSIS — R200 Anesthesia of skin: Secondary | ICD-10-CM

## 2020-06-22 DIAGNOSIS — R739 Hyperglycemia, unspecified: Secondary | ICD-10-CM | POA: Diagnosis not present

## 2020-06-22 DIAGNOSIS — M25561 Pain in right knee: Secondary | ICD-10-CM | POA: Diagnosis not present

## 2020-06-22 DIAGNOSIS — Z0000 Encounter for general adult medical examination without abnormal findings: Secondary | ICD-10-CM

## 2020-06-22 DIAGNOSIS — G4700 Insomnia, unspecified: Secondary | ICD-10-CM

## 2020-06-22 DIAGNOSIS — E669 Obesity, unspecified: Secondary | ICD-10-CM

## 2020-06-22 DIAGNOSIS — I10 Essential (primary) hypertension: Secondary | ICD-10-CM | POA: Diagnosis not present

## 2020-06-22 DIAGNOSIS — E538 Deficiency of other specified B group vitamins: Secondary | ICD-10-CM

## 2020-06-22 DIAGNOSIS — M79661 Pain in right lower leg: Secondary | ICD-10-CM | POA: Diagnosis not present

## 2020-06-22 DIAGNOSIS — Z1329 Encounter for screening for other suspected endocrine disorder: Secondary | ICD-10-CM | POA: Diagnosis not present

## 2020-06-22 DIAGNOSIS — R202 Paresthesia of skin: Secondary | ICD-10-CM | POA: Diagnosis not present

## 2020-06-22 DIAGNOSIS — E559 Vitamin D deficiency, unspecified: Secondary | ICD-10-CM

## 2020-06-22 DIAGNOSIS — M25521 Pain in right elbow: Secondary | ICD-10-CM

## 2020-06-22 DIAGNOSIS — R768 Other specified abnormal immunological findings in serum: Secondary | ICD-10-CM

## 2020-06-22 LAB — BASIC METABOLIC PANEL
BUN: 11 mg/dL (ref 6–23)
CO2: 24 mEq/L (ref 19–32)
Calcium: 9 mg/dL (ref 8.4–10.5)
Chloride: 106 mEq/L (ref 96–112)
Creatinine, Ser: 0.9 mg/dL (ref 0.40–1.20)
GFR: 82.76 mL/min (ref >60.00)
Glucose, Bld: 91 mg/dL (ref 70–99)
Potassium: 4.2 mEq/L (ref 3.5–5.1)
Sodium: 137 mEq/L (ref 135–145)

## 2020-06-22 LAB — LIPID PANEL
Cholesterol: 168 mg/dL (ref 0–200)
HDL: 67.8 mg/dL (ref >39.00)
LDL Cholesterol: 90 mg/dL (ref 0–99)
NonHDL: 100.64
Total CHOL/HDL Ratio: 2
Triglycerides: 54 mg/dL (ref 0.0–149.0)
VLDL: 10.8 mg/dL (ref 0.0–40.0)

## 2020-06-22 LAB — HEMOGLOBIN A1C: Hgb A1c MFr Bld: 5.4 % (ref 4.6–6.5)

## 2020-06-22 LAB — VITAMIN D 25 HYDROXY (VIT D DEFICIENCY, FRACTURES): VITD: 20.89 ng/mL — ABNORMAL LOW (ref 30.00–100.00)

## 2020-06-22 LAB — TSH: TSH: 3.6 u[IU]/mL (ref 0.35–4.50)

## 2020-06-22 LAB — FOLATE: Folate: 7.8 ng/mL (ref >5.9)

## 2020-06-22 LAB — VITAMIN B12: Vitamin B-12: 136 pg/mL — ABNORMAL LOW (ref 211–911)

## 2020-06-22 LAB — D-DIMER, QUANTITATIVE: D-Dimer, Quant: 0.26 mcg/mL FEU (ref <0.50)

## 2020-06-22 MED ORDER — AMLODIPINE BESYLATE 2.5 MG PO TABS: 2.5000 mg | ORAL_TABLET | Freq: Every day | ORAL | 3 refills | Status: DC

## 2020-06-22 MED ORDER — TRAZODONE HCL 50 MG PO TABS: 25.0000 mg | ORAL_TABLET | Freq: Every evening | ORAL | 3 refills | Status: DC | PRN

## 2020-06-22 NOTE — Progress Notes (Signed)
Chief Complaint  Patient presents with  . Follow-up  . Numbness    on and off in the legs and arms. Ongoing for 6 months    Annual  1. C/o right arm numbness x 6 months mostly at night nothing tried but position changes  2. C/o 10/10 right knee and right lower leg pain tried tylenol w/o help aching pain on and off x months with b/l knee pain as well no trauma 3. Right kidney stone she thinks she may have passed it  4. HTN on norvasc 2.5 but off x 3 days due to refill needed  5. Insomnia tried melatonin in the past w/o help     Review of Systems  Constitutional: Negative for weight loss.  HENT: Negative for hearing loss.   Eyes: Negative for blurred vision.  Respiratory: Negative for shortness of breath.   Cardiovascular: Negative for chest pain.  Gastrointestinal: Negative for abdominal pain.  Musculoskeletal: Positive for joint pain.  Skin: Negative for rash.  Neurological: Positive for sensory change.  Psychiatric/Behavioral: Negative for depression. The patient has insomnia.    Past Medical History:  Diagnosis Date  . Abnormal Pap smear of cervix    lsil 01/06/13 no HPV testing done s/p cervical bx 01/15/13 negative +HPV/chronic cervicitis; pap 04/07/13 lsil HPV + f/u 09/17/13 negative; pap 01/14/14 Lsil HPV +; 08/26/14 negative transformation zone absent; pap 01/19/15 negative absent zone; pap 02/08/16 negative; pap 02/19/17 negative; [facility]   . GERD (gastroesophageal reflux disease)   . Headache   . HTN (hypertension)    Past Surgical History:  Procedure Laterality Date  . KNEE SURGERY     left knee arthroscopy 44 y.o   . TUBAL LIGATION    . WISDOM TOOTH EXTRACTION     Family History  Problem Relation Age of Onset  . Hypertension Mother   . Cancer Father        ? type mets  . Multiple sclerosis Sister   . Cancer Brother        pancreatitic age 43  . Breast cancer Neg Hx    Social History   Socioeconomic History  . Marital status: Single     Spouse name: Not on file  . Number of children: Not on file  . Years of education: Not on file  . Highest education level: Not on file  Occupational History  . Not on file  Tobacco Use  . Smoking status: Former Games developermoker  . Smokeless tobacco: Never Used  Vaping Use  . Vaping Use: Never used  Substance and Sexual Activity  . Alcohol use: Yes    Comment: occ  . Drug use: No  . Sexual activity: Yes  Other Topics Concern  . Not on file  Social History Narrative   Works [employer] pt access    3 kids daughter 43 y.o 2 sons 43 y.o and 43 y.o as of 05/2019    -daughter lives outside of the home    1 cat at home   DPR mother Cecile HearingBecky Hernandez [phone]   Social Determinants of Health   Financial Resource Strain:   . Difficulty of Paying Living Expenses: Not on file  Food Insecurity:   . Worried About Programme researcher, broadcasting/film/videounning Out of Food in the Last Year: Not on file  . Ran Out of Food in the Last Year: Not on file  Transportation Needs:   . Lack of Transportation (Medical): Not on file  . Lack of Transportation (Non-Medical): Not  on file  Physical Activity:   . Days of Exercise per Week: Not on file  . Minutes of Exercise per Session: Not on file  Stress:   . Feeling of Stress : Not on file  Social Connections:   . Frequency of Communication with Friends and Family: Not on file  . Frequency of Social Gatherings with Friends and Family: Not on file  . Attends Religious Services: Not on file  . Active Member of Clubs or Organizations: Not on file  . Attends Banker Meetings: Not on file  . Marital Status: Not on file  Intimate Partner Violence:   . Fear of Current or Ex-Partner: Not on file  . Emotionally Abused: Not on file  . Physically Abused: Not on file  . Sexually Abused: Not on file   Current Meds  Medication Sig  . amLODipine (NORVASC) 2.5 MG tablet Take 1 tablet (2.5 mg total) by mouth daily.  . Cholecalciferol 1.25 MG (50000 UT) capsule Take 1 capsule (50,000 Units total) by  mouth once a week. X 6 months then take otc D3 5000 IU daily  . oxyCODONE-acetaminophen (PERCOCET) 5-325 MG tablet Take 1 tablet by mouth 2 (two) times daily as needed for severe pain.  . [DISCONTINUED] amLODipine (NORVASC) 2.5 MG tablet Take 1 tablet (2.5 mg total) by mouth daily.  . [DISCONTINUED] methocarbamol (ROBAXIN) 500 MG tablet Take 500 mg by mouth at bedtime as needed for muscle spasms.  . [DISCONTINUED] ondansetron (ZOFRAN) 4 MG tablet Take 1 tablet (4 mg total) by mouth daily as needed for nausea or vomiting.  . [DISCONTINUED] tamsulosin (FLOMAX) 0.4 MG CAPS capsule Take 1 capsule (0.4 mg total) by mouth daily after supper.   Allergies  Allergen Reactions  . Latex Hives   Recent Results (from the past 2160 hour(s))  Lipase, blood     Status: None   Collection Time: 05/02/20  2:34 PM  Result Value Ref Range   Lipase 33 11 - 51 U/L    Comment: Performed at [facility], [address] [place], [place] [address]  Comprehensive metabolic panel     Status: Abnormal   Collection Time: 05/02/20  2:34 PM  Result Value Ref Range   Sodium 136 135 - 145 mmol/L   Potassium 3.9 3.5 - 5.1 mmol/L   Chloride 109 98 - 111 mmol/L   CO2 21 (L) 22 - 32 mmol/L   Glucose, Bld 109 (H) 70 - 99 mg/dL    Comment: Glucose reference range applies only to samples taken after fasting for at least 8 hours.   BUN 13 6 - 20 mg/dL   Creatinine, Ser 8.46 (H) 0.44 - 1.00 mg/dL   Calcium 8.8 (L) 8.9 - 10.3 mg/dL   Total Protein 7.2 6.5 - 8.1 g/dL   Albumin 4.1 3.5 - 5.0 g/dL   AST 15 15 - 41 U/L   ALT 11 0 - 44 U/L   Alkaline Phosphatase 51 38 - 126 U/L   Total Bilirubin 0.7 0.3 - 1.2 mg/dL   GFR calc non Af Amer >60 >60 mL/min   GFR calc Af Amer >60 >60 mL/min   Anion gap 6 5 - 15    Comment: Performed at [facility], [address] [place], [place] [address]  CBC     Status: None   Collection Time: 05/02/20  2:34 PM  Result Value Ref Range   WBC 7.7 4.0 - 10.5 K/uL   RBC  4.18 3.87 -  5.11 MIL/uL   Hemoglobin 12.7 12.0 - 15.0 g/dL   HCT 02.4 36 - 46 %   MCV 89.2 80.0 - 100.0 fL   MCH 30.4 26.0 - 34.0 pg   MCHC 34.0 30.0 - 36.0 g/dL   RDW 09.7 35.3 - 29.9 %   Platelets 292 150 - 400 K/uL   nRBC 0.0 0.0 - 0.2 %    Comment: Performed at [facility], [address] [place], [place] [address]   Objective  Body mass index is 37.34 kg/m. Wt Readings from Last 3 Encounters:  06/22/20 224 lb 6.4 oz (101.8 kg)  05/03/20 218 lb 9.6 oz (99.2 kg)  05/02/20 221 lb (100.2 kg)   Temp Readings from Last 3 Encounters:  06/22/20 99.2 F (37.3 C) (Oral)  05/03/20 97.8 F (36.6 C) (Oral)  05/02/20 98.7 F (37.1 C) (Oral)   BP Readings from Last 3 Encounters:  06/22/20 110/80  05/03/20 124/80  05/02/20 140/82   Pulse Readings from Last 3 Encounters:  06/22/20 72  05/03/20 61  05/02/20 60    Physical Exam Vitals and nursing note reviewed.  Constitutional:      Appearance: Normal appearance. She is well-developed and well-groomed. She is obese.  HENT:     Head: Normocephalic and atraumatic.  Eyes:     Conjunctiva/sclera: Conjunctivae normal.     Pupils: Pupils are equal, round, and reactive to light.  Cardiovascular:     Rate and Rhythm: Normal rate and regular rhythm.     Heart sounds: Normal heart sounds. No murmur heard.   Pulmonary:     Effort: Pulmonary effort is normal.     Breath sounds: Normal breath sounds.  Musculoskeletal:       Arms:       Legs:  Skin:    General: Skin is warm and dry.  Neurological:     General: No focal deficit present.     Mental Status: She is alert and oriented to person, place, and time. Mental status is at baseline.     Gait: Gait normal.  Psychiatric:        Attention and Perception: Attention and perception normal.        Mood and Affect: Mood and affect normal.        Speech: Speech normal.        Behavior: Behavior normal. Behavior is cooperative.        Thought Content: Thought content  normal.        Cognition and Memory: Cognition and memory normal.        Judgment: Judgment normal.     Assessment  Plan  Annual physical exam Flu shot10/2/20 Tdap ? Year covid pfizer had 2/2   mammosch 11/12/20had repeat US 09/04/19 1.8 x 1.4 x 1.8 left cyst repeat in 1 year -referred   [facility] h/o abnormal papper pt had cervical procedure on cervix ? leep or cone bx -pap 02/27/18 normal  Colonoscopy sch age 18 y.o   Declines STD check  rec healthy diet and exercise   Essential hypertension - Plan: Basic Metabolic Panel (BMET), Lipid panel, amLODipine (NORVASC) 2.5 MG tablet  B12 deficiency - Plan: B12  Numbness and tingling of right arm with right elbow pain - Plan: B12, Folate, Antinuclear Antib (ANA) [facility]er [facility] may need EMG/NCS   Folate deficiency - Plan: Folate  Thyroid disorder screen - Plan: TSH  Hyperglycemia - Plan: Hemoglobin A1c  Lipid screening - Plan: Lipid panel  Vitamin D deficiency - Plan: Vitamin D (25 hydroxy)  Insomnia, unspecified type - Plan: traZODone (DESYREL) 25-50 MG tablet  Pain in right lower leg - Plan: DG Knee Complete 4 Views Right, D-Dimer, Quantitative Could be referred pain from knee  [facility]er [facility]   Acute pain of right knee - Plan: DG Knee Complete 4 Views Right Ortho   Obesity (BMI 30-39.9)  rec healthy diet and exercise    Provider: Dr. French Ana McLean-Scocuzza-Internal Medicine

## 2020-06-22 NOTE — Patient Instructions (Addendum)
Call employee health about date of Tdap vaccine  voltaren gel rub into right knee and leg with heat

## 2020-06-24 ENCOUNTER — Encounter: Payer: Self-pay | Admitting: Internal Medicine

## 2020-06-24 ENCOUNTER — Other Ambulatory Visit: Payer: Self-pay | Admitting: Internal Medicine

## 2020-06-24 ENCOUNTER — Telehealth: Payer: Self-pay | Admitting: Internal Medicine

## 2020-06-24 DIAGNOSIS — E559 Vitamin D deficiency, unspecified: Secondary | ICD-10-CM

## 2020-06-24 DIAGNOSIS — E538 Deficiency of other specified B group vitamins: Secondary | ICD-10-CM | POA: Insufficient documentation

## 2020-06-24 DIAGNOSIS — R768 Other specified abnormal immunological findings in serum: Secondary | ICD-10-CM

## 2020-06-24 LAB — ANTI-NUCLEAR AB-TITER (ANA TITER): ANA Titer 1: 1:40 {titer} — ABNORMAL HIGH

## 2020-06-24 LAB — ANA: Anti Nuclear Antibody (ANA): POSITIVE — AB

## 2020-06-24 MED ORDER — CHOLECALCIFEROL 1.25 MG (50000 UT) PO CAPS: 50000.0000 [IU] | ORAL_CAPSULE | ORAL | 1 refills | Status: DC

## 2020-06-24 NOTE — Telephone Encounter (Signed)
Patient calling to inquire about recent lab results. These have not been resulted but were automatically released to mychart.   Patient's main question was about her positive ANA result and what it meant.   Please advise

## 2020-06-27 DIAGNOSIS — M1711 Unilateral primary osteoarthritis, right knee: Secondary | ICD-10-CM | POA: Diagnosis not present

## 2020-06-27 DIAGNOSIS — S56911A Strain of unspecified muscles, fascia and tendons at forearm level, right arm, initial encounter: Secondary | ICD-10-CM | POA: Diagnosis not present

## 2020-06-27 NOTE — Telephone Encounter (Signed)
D dimer normal no concern for blood clot ANA + which is nonspecific but could be marker for autoimmune disorder or not  Your titer is low 1:40 cytoplasmic   -we need to check antimitrochrondrial/antismooth muscle antibodies to further work this up which are GI autoimmune issues -schedule repeat labs for this   Vitamin D low  -sent high dose vitamin D   B12 low  -rec B12 shots 1x per week x 1 month then every 30 days  -does she want to come here or give them to herself?  If here schedule   Folate and thyroid labs normal No prediabetes  Kidney #s normal  Cholesterol improved

## 2020-06-28 ENCOUNTER — Other Ambulatory Visit: Payer: Self-pay

## 2020-06-28 NOTE — Telephone Encounter (Signed)
Patient informed and verbalized understanding.  She would like the b12 sent to her pharmacy. States her sister is a Engineer, civil (consulting) and can do them.  Patient has been scheduled for repeat labs this Thursday.

## 2020-06-28 NOTE — Telephone Encounter (Signed)
-----   Message from Bevelyn Buckles, MD sent at 06/24/2020  4:56 PM EDT ----- Xray right knee normal

## 2020-06-29 ENCOUNTER — Other Ambulatory Visit: Payer: Self-pay | Admitting: Internal Medicine

## 2020-06-29 DIAGNOSIS — E538 Deficiency of other specified B group vitamins: Secondary | ICD-10-CM

## 2020-06-29 MED ORDER — "SYRINGE 25G X 1-1/2"" 3 ML MISC": 1.0000 | 11 refills | Status: DC

## 2020-06-29 MED ORDER — CYANOCOBALAMIN 1000 MCG/ML IJ SOLN: INTRAMUSCULAR | 11 refills | Status: DC

## 2020-06-30 ENCOUNTER — Other Ambulatory Visit: Payer: 59

## 2020-06-30 ENCOUNTER — Other Ambulatory Visit (INDEPENDENT_AMBULATORY_CARE_PROVIDER_SITE_OTHER): Payer: 59

## 2020-06-30 ENCOUNTER — Other Ambulatory Visit: Payer: Self-pay

## 2020-06-30 DIAGNOSIS — R768 Other specified abnormal immunological findings in serum: Secondary | ICD-10-CM | POA: Diagnosis not present

## 2020-06-30 NOTE — Addendum Note (Signed)
Addended by: Bonnell Public I on: 06/30/2020 03:12 PM   Modules accepted: Orders

## 2020-07-01 LAB — SPECIMEN STATUS REPORT

## 2020-07-01 LAB — MITOCHONDRIAL/SMOOTH MUSCLE AB PNL
Mitochondrial Ab: <20 Units (ref 0.0–20.0)
Smooth Muscle Ab: 19 Units (ref 0–19)

## 2020-07-04 ENCOUNTER — Encounter: Payer: Self-pay | Admitting: Internal Medicine

## 2020-07-04 NOTE — Addendum Note (Signed)
Addended by: Quentin Ore on: 07/04/2020 10:01 PM   Modules accepted: Orders

## 2020-07-05 NOTE — Telephone Encounter (Signed)
-----   Message from Bevelyn Buckles, MD sent at 07/04/2020  3:36 PM EDT ----- Labs for autoimmune hepatitis and primary biliary cirrhosis negative  If she wants referral to rheumatology to further work up elevated ANA let me know though her level was low  They would do further testing

## 2020-07-18 ENCOUNTER — Encounter: Payer: Self-pay | Admitting: Internal Medicine

## 2020-07-25 DIAGNOSIS — M1711 Unilateral primary osteoarthritis, right knee: Secondary | ICD-10-CM | POA: Diagnosis not present

## 2020-07-25 DIAGNOSIS — S56911A Strain of unspecified muscles, fascia and tendons at forearm level, right arm, initial encounter: Secondary | ICD-10-CM | POA: Diagnosis not present

## 2020-08-12 DIAGNOSIS — Z20822 Contact with and (suspected) exposure to covid-19: Secondary | ICD-10-CM | POA: Diagnosis not present

## 2020-08-12 DIAGNOSIS — Z03818 Encounter for observation for suspected exposure to other biological agents ruled out: Secondary | ICD-10-CM | POA: Diagnosis not present

## 2020-08-22 ENCOUNTER — Telehealth: Payer: Self-pay | Admitting: Internal Medicine

## 2020-08-22 ENCOUNTER — Encounter: Payer: Self-pay | Admitting: Internal Medicine

## 2020-08-22 NOTE — Telephone Encounter (Signed)
Rejection Reason - Patient was No Show" [facility] said on Aug 22, 2020 9:19 AM

## 2020-10-13 ENCOUNTER — Ambulatory Visit
Admission: RE | Admit: 2020-10-13 | Discharge: 2020-10-13 | Disposition: A | Discharge status: Home / Self Care | Payer: 59 | Source: Ambulatory Visit | Attending: Internal Medicine | Admitting: Internal Medicine

## 2020-10-13 ENCOUNTER — Other Ambulatory Visit: Payer: Self-pay

## 2020-10-13 ENCOUNTER — Other Ambulatory Visit: Payer: Self-pay | Admitting: Internal Medicine

## 2020-10-13 DIAGNOSIS — R928 Other abnormal and inconclusive findings on diagnostic imaging of breast: Secondary | ICD-10-CM

## 2020-10-13 DIAGNOSIS — N6320 Unspecified lump in the left breast, unspecified quadrant: Secondary | ICD-10-CM

## 2020-10-13 DIAGNOSIS — N6310 Unspecified lump in the right breast, unspecified quadrant: Secondary | ICD-10-CM

## 2020-10-13 DIAGNOSIS — Z1231 Encounter for screening mammogram for malignant neoplasm of breast: Secondary | ICD-10-CM | POA: Diagnosis not present

## 2020-10-17 ENCOUNTER — Ambulatory Visit
Admission: RE | Admit: 2020-10-17 | Discharge: 2020-10-17 | Disposition: A | Discharge status: Home / Self Care | Payer: 59 | Source: Ambulatory Visit | Attending: Internal Medicine | Admitting: Internal Medicine

## 2020-10-17 ENCOUNTER — Other Ambulatory Visit: Payer: Self-pay

## 2020-10-17 DIAGNOSIS — R928 Other abnormal and inconclusive findings on diagnostic imaging of breast: Secondary | ICD-10-CM | POA: Diagnosis not present

## 2020-10-17 DIAGNOSIS — N6320 Unspecified lump in the left breast, unspecified quadrant: Secondary | ICD-10-CM | POA: Diagnosis not present

## 2020-10-17 DIAGNOSIS — N6002 Solitary cyst of left breast: Secondary | ICD-10-CM | POA: Diagnosis not present

## 2020-10-17 DIAGNOSIS — N6310 Unspecified lump in the right breast, unspecified quadrant: Secondary | ICD-10-CM | POA: Insufficient documentation

## 2020-10-17 DIAGNOSIS — N6001 Solitary cyst of right breast: Secondary | ICD-10-CM | POA: Diagnosis not present

## 2020-10-17 DIAGNOSIS — R922 Inconclusive mammogram: Secondary | ICD-10-CM | POA: Diagnosis not present

## 2020-10-20 ENCOUNTER — Telehealth: Payer: Self-pay

## 2020-10-20 NOTE — Telephone Encounter (Signed)
Patient informed and verbalized understanding

## 2020-10-20 NOTE — Telephone Encounter (Signed)
-----   Message from Bevelyn Buckles, MD sent at 10/17/2020  6:06 PM EST ----- Breast cysts not cancerous

## 2020-10-20 NOTE — Telephone Encounter (Signed)
Pt is returning call about mammogram biopsy results

## 2020-10-20 NOTE — Telephone Encounter (Signed)
Left message to return call 

## 2020-10-27 ENCOUNTER — Encounter: Payer: Self-pay | Admitting: Internal Medicine

## 2020-10-27 ENCOUNTER — Other Ambulatory Visit: Payer: Self-pay | Admitting: Internal Medicine

## 2020-10-27 ENCOUNTER — Ambulatory Visit (INDEPENDENT_AMBULATORY_CARE_PROVIDER_SITE_OTHER): Payer: 59 | Admitting: Internal Medicine

## 2020-10-27 ENCOUNTER — Other Ambulatory Visit (HOSPITAL_COMMUNITY)
Admission: RE | Admit: 2020-10-27 | Discharge: 2020-10-27 | Disposition: A | Discharge status: Home / Self Care | Payer: 59 | Source: Ambulatory Visit | Attending: Internal Medicine | Admitting: Internal Medicine

## 2020-10-27 ENCOUNTER — Other Ambulatory Visit: Payer: Self-pay

## 2020-10-27 VITALS — BP 118/82 | HR 77 | Temp 97.7°F | Ht 65.0 in | Wt 230.6 lb

## 2020-10-27 DIAGNOSIS — E538 Deficiency of other specified B group vitamins: Secondary | ICD-10-CM

## 2020-10-27 DIAGNOSIS — R319 Hematuria, unspecified: Secondary | ICD-10-CM

## 2020-10-27 DIAGNOSIS — R300 Dysuria: Secondary | ICD-10-CM

## 2020-10-27 DIAGNOSIS — E669 Obesity, unspecified: Secondary | ICD-10-CM

## 2020-10-27 MED ORDER — CIPROFLOXACIN HCL 500 MG PO TABS: 500.0000 mg | ORAL_TABLET | Freq: Two times a day (BID) | ORAL | 0 refills | Status: DC

## 2020-10-27 MED ORDER — PHENAZOPYRIDINE HCL 200 MG PO TABS: 200.0000 mg | ORAL_TABLET | Freq: Three times a day (TID) | ORAL | 0 refills | Status: DC | PRN

## 2020-10-27 NOTE — Progress Notes (Signed)
Chief Complaint  Patient presents with  . Urinary Tract Infection    Symptoms onset Monday with burning/pain with urination and light amounts of blood.  Patient drank a lot of water and cranberry juice, symptoms are improving.    F/u  1. UTI pain with urination started Monday and burning with urination had water, cranberry juice but sx's still present no itching or discharge 2. B12 not able to tolerate B12 shots will try otc    Review of Systems  Constitutional: Negative for weight loss.  Respiratory: Negative for shortness of breath.   Cardiovascular: Negative for chest pain.  Genitourinary: Positive for dysuria and hematuria.  Musculoskeletal:       +right leg pain at night    Past Medical History:  Diagnosis Date  . Abnormal Pap smear of cervix    lsil 01/06/13 no HPV testing done s/p cervical bx 01/15/13 negative +HPV/chronic cervicitis; pap 04/07/13 lsil HPV + f/u 09/17/13 negative; pap 01/14/14 Lsil HPV +; 08/26/14 negative transformation zone absent; pap 01/19/15 negative absent zone; pap 02/08/16 negative; pap 02/19/17 negative; [facility] 02/27/18 [name]   . GERD (gastroesophageal reflux disease)   . Headache   . HTN (hypertension)    Past Surgical History:  Procedure Laterality Date  . KNEE SURGERY     left knee arthroscopy 45 y.o   . TUBAL LIGATION    . WISDOM TOOTH EXTRACTION     Family History  Problem Relation Age of Onset  . Hypertension Mother   . Cancer Father        ? type mets  . Multiple sclerosis Sister   . Cancer Brother        pancreatitic age 1  . Breast cancer Neg Hx    Social History   Socioeconomic History  . Marital status: Single    Spouse name: Not on file  . Number of children: Not on file  . Years of education: Not on file  . Highest education level: Not on file  Occupational History  . Not on file  Tobacco Use  . Smoking status: Former Games developer  . Smokeless tobacco: Never Used  Vaping Use  . Vaping Use: Never used  Substance and  Sexual Activity  . Alcohol use: Yes    Comment: occ  . Drug use: No  . Sexual activity: Yes  Other Topics Concern  . Not on file  Social History Narrative   Works [facility] [employer] [facility] pt access    3 kids daughter 44 y.o 2 sons 16 y.o and 15 y.o as of 05/2019    -daughter lives outside of the home    1 cat at home   DPR mother Tyler Robidoux [phone]   Social Determinants of Health   Financial Resource Strain: Not on file  Food Insecurity: Not on file  Transportation Needs: Not on file  Physical Activity: Not on file  Stress: Not on file  Social Connections: Not on file  Intimate Partner Violence: Not on file   Current Meds  Medication Sig  . amLODipine (NORVASC) 2.5 MG tablet Take 1 tablet (2.5 mg total) by mouth daily.  . Cholecalciferol 1.25 MG (50000 UT) capsule Take 1 capsule (50,000 Units total) by mouth once a week. X 6 months then take otc D3 5000 IU daily  . ciprofloxacin (CIPRO) 500 MG tablet Take 1 tablet (500 mg total) by mouth 2 (two) times daily.  . phenazopyridine (PYRIDIUM) 200 MG tablet Take 1 tablet (200 mg total) by mouth  3 (three) times daily as needed for pain.   Allergies  Allergen Reactions  . Latex Hives   No results found for this or any previous visit (from the past 2160 hour(s)). Objective  Body mass index is 38.37 kg/m. Wt Readings from Last 3 Encounters:  10/27/20 230 lb 9.6 oz (104.6 kg)  06/22/20 224 lb 6.4 oz (101.8 kg)  05/03/20 218 lb 9.6 oz (99.2 kg)   Temp Readings from Last 3 Encounters:  10/27/20 97.7 F (36.5 C) (Oral)  06/22/20 99.2 F (37.3 C) (Oral)  05/03/20 97.8 F (36.6 C) (Oral)   BP Readings from Last 3 Encounters:  10/27/20 118/82  06/22/20 110/80  05/03/20 124/80   Pulse Readings from Last 3 Encounters:  10/27/20 77  06/22/20 72  05/03/20 61    Physical Exam Vitals and nursing note reviewed.  Constitutional:      Appearance: Normal appearance. She is well-developed and well-groomed. She is obese.  HENT:     Head:  Normocephalic and atraumatic.  Eyes:     Conjunctiva/sclera: Conjunctivae normal.     Pupils: Pupils are equal, round, and reactive to light.  Cardiovascular:     Rate and Rhythm: Normal rate and regular rhythm.     Heart sounds: Normal heart sounds. No murmur heard.   Pulmonary:     Effort: Pulmonary effort is normal.     Breath sounds: Normal breath sounds.  Abdominal:     Tenderness: There is no abdominal tenderness. There is no right CVA tenderness or left CVA tenderness.  Skin:    General: Skin is warm and dry.  Neurological:     General: No focal deficit present.     Mental Status: She is alert and oriented to person, place, and time. Mental status is at baseline.     Gait: Gait normal.  Psychiatric:        Attention and Perception: Attention and perception normal.        Mood and Affect: Mood and affect normal.        Speech: Speech normal.        Behavior: Behavior normal. Behavior is cooperative.        Thought Content: Thought content normal.        Cognition and Memory: Cognition and memory normal.        Judgment: Judgment normal.     Assessment  Plan  Hematuria+ dysuria - Plan: Urine Culture, Urine cytology ancillary only([place]), phenazopyridine (PYRIDIUM) 200 MG tablet, ciprofloxacin (CIPRO) 500 MG tablet H/o kidney stones right CT renal 02/09/20 +  B12 deficiency rec otc B12 500 mcg daily otc   Obesity  rec healthy diet and exercise consider wt loss clinic   HM Flu shotutd Tdap ? Year covid pfizer had 2/2 consider booster   mammosch 11/12/20had repeat US 09/04/19 1.8 x 1.4 x 1.8 left cyst repeat in 1 year -referred 10/17/20 b/l cysts   [facility] h/o abnormal papper pt had cervical procedure on cervix ? leep or cone bx -pap 02/27/18 normal  Colonoscopy sch age 38 y.o   Declines STD check  rec healthy diet and exercise   Provider: Dr. French Ana McLean-Scocuzza-Internal Medicine

## 2020-10-27 NOTE — Patient Instructions (Addendum)
B12 500 mcg daily otc  Debrox ear wax   Exercising to Stay Healthy To become healthy and stay healthy, it is recommended that you do moderate-intensity and vigorous-intensity exercise. You can tell that you are exercising at a moderate intensity if your heart starts beating faster and you start breathing faster but can still hold a conversation. You can tell that you are exercising at a vigorous intensity if you are breathing much harder and faster and cannot hold a conversation while exercising. Exercising regularly is important. It has many health benefits, such as:  Improving overall fitness, flexibility, and endurance.  Increasing bone density.  Helping with weight control.  Decreasing body fat.  Increasing muscle strength.  Reducing stress and tension.  Improving overall health. How often should I exercise? Choose an activity that you enjoy, and set realistic goals. Your health care provider can help you make an activity plan that works for you. Exercise regularly as told by your health care provider. This may include:  Doing strength training two times a week, such as: ? Lifting weights. ? Using resistance bands. ? Push-ups. ? Sit-ups. ? Yoga.  Doing a certain intensity of exercise for a given amount of time. Choose from these options: ? A total of 150 minutes of moderate-intensity exercise every week. ? A total of 75 minutes of vigorous-intensity exercise every week. ? A mix of moderate-intensity and vigorous-intensity exercise every week. Children, pregnant women, people who have not exercised regularly, people who are overweight, and older adults may need to talk with a health care provider about what activities are safe to do. If you have a medical condition, be sure to talk with your health care provider before you start a new exercise program. What are some exercise ideas? Moderate-intensity exercise ideas include:  Walking 1 mile (1.6 km) in about 15  minutes.  Biking.  Hiking.  Golfing.  Dancing.  Water aerobics. Vigorous-intensity exercise ideas include:  Walking 4.5 miles (7.2 km) or more in about 1 hour.  Jogging or running 5 miles (8 km) in about 1 hour.  Biking 10 miles (16.1 km) or more in about 1 hour.  Lap swimming.  Roller-skating or in-line skating.  Cross-country skiing.  Vigorous competitive sports, such as football, basketball, and soccer.  Jumping rope.  Aerobic dancing.   What are some everyday activities that can help me to get exercise?  [place] work, such as: ? Pushing a Conservation officer, nature. ? Raking and bagging leaves.  Washing your car.  Pushing a stroller.  Shoveling snow.  Gardening.  Washing windows or floors. How can I be more active in my day-to-day activities?  Use stairs instead of an elevator.  Take a walk during your lunch break.  If you drive, park your car farther away from your work or school.  If you take public transportation, get off one stop early and walk the rest of the way.  Stand up or walk around during all of your indoor phone calls.  Get up, stretch, and walk around every 30 minutes throughout the day.  Enjoy exercise with a friend. Support to continue exercising will help you keep a regular routine of activity. What guidelines can I follow while exercising?  Before you start a new exercise program, talk with your health care provider.  Do not exercise so much that you hurt yourself, feel dizzy, or get very short of breath.  Wear comfortable clothes and wear shoes with good support.  Drink plenty of water while you  exercise to prevent dehydration or heat stroke.  Work out until your breathing and your heartbeat get faster. Where to find more information  U.S. Department of Health and Human Services: Bonded[employer].at  Centers for Disease Control and Prevention (CDC): http://www.wolf.info/ Summary  Exercising regularly is important. It will improve your overall fitness,  flexibility, and endurance.  Regular exercise also will improve your overall health. It can help you control your weight, reduce stress, and improve your bone density.  Do not exercise so much that you hurt yourself, feel dizzy, or get very short of breath.  Before you start a new exercise program, talk with your health care provider. This information is not intended to replace advice given to you by your health care provider. Make sure you discuss any questions you have with your health care provider. Document Revised: 09/13/2017 Document Reviewed: 08/22/2017 Elsevier Patient Education  2021 [place].  Budget-Friendly Healthy Eating There are many ways to save money at the grocery store and continue to eat healthy. You can be successful if you:  Plan meals according to your budget.  Make a grocery list and only purchase food according to your grocery list.  Prepare food yourself at home. What are tips for following this plan? Reading food labels  Compare food labels between brand name foods and the store brand. Often the nutritional value is the same, but the store brand is lower cost.  Look for products that do not have added sugar, fat, or salt (sodium). These often cost the same but are healthier for you. Products may be labeled as: ? Sugar-free. ? Nonfat. ? Low-fat. ? Sodium-free. ? Low-sodium.  Look for lean ground beef labeled as at least 92% lean and 8% fat. Shopping  Buy only the items on your grocery list and go only to the areas of the store that have the items on your list.  Use coupons only for foods and brands you normally buy. Avoid buying items you wouldn't normally buy simply because they are on sale.  Check online and in newspapers for weekly deals.  Buy healthy items from the bulk bins when available, such as herbs, spices, flour, pasta, nuts, and dried fruit.  Buy fruits and vegetables that are in season. Prices are usually lower on in-season  produce.  Look at the unit price on the price tag. Use it to compare different brands and sizes to find out which item is the best deal.  Choose healthy items that are often low-cost, such as carrots, potatoes, apples, bananas, and oranges. Dried or canned beans are a low-cost protein source.  Buy in bulk and freeze extra food. Items you can buy in bulk include meats, fish, poultry, frozen fruits, and frozen vegetables.  Avoid buying "ready-to-eat" foods, such as pre-cut fruits and vegetables and pre-made salads.  If possible, shop around to discover where you can find the best prices. Consider other retailers such as dollar stores, larger [employer], local fruit and vegetable stands, and farmers markets.  Do not shop when you are hungry. If you shop while hungry, it may be hard to stick to your list and budget.  Resist impulse buying. Use your grocery list as your official plan for the week.  Buy a variety of vegetables and fruits by purchasing fresh, frozen, and canned items.  Look at the top and bottom shelves for deals. Foods at eye level (eye level of an adult or child) are usually more expensive.  Be efficient with your time when  shopping. The more time you spend at the store, the more money you are likely to spend.  To save money when choosing more expensive foods like meats and dairy: ? Choose cheaper cuts of meat, such as bone-in chicken thighs and drumsticks instead of skinless and boneless chicken. When you are ready to prepare the chicken, you can remove the skin yourself to make it healthier. ? Choose lean meats like chicken or [place] instead of beef. ? Choose canned seafood, such as tuna, salmon, or sardines. ? Buy eggs as a low-cost source of protein. ? Buy dried beans and peas, such as lentils, split peas, or kidney beans instead of meats. Dried beans and peas are a good alternative source of protein. ? Buy the larger tubs of yogurt instead of individual-sized  containers.  Choose water instead of sodas and other sweetened beverages.  Avoid buying chips, cookies, and other "junk food." These items are usually expensive and not healthy.   Cooking  Make extra food and freeze the extras in meal-sized containers or in individual portions for fast meals and snacks.  Pre-cook on days when you have extra time to prepare meals in advance. You can keep these meals in the fridge or freezer and reheat for a quick meal.  When you come home from the grocery store, wash, peel, and cut fruits and vegetables so they are ready to use and eat. This will help reduce food waste. Meal planning  Do not eat out or get fast food. Prepare food at home.  Make a grocery list and make sure to bring it with you to the store. If you have a smart phone, you could use your phone to create your shopping list.  Plan meals and snacks according to a grocery list and budget you create.  Use leftovers in your meal plan for the week.  Look for recipes where you can cook once and make enough food for two meals.  Prepare budget-friendly types of meals like stews, casseroles, and stir-fry dishes.  Try some meatless meals or try "no cook" meals like salads.  Make sure that half your plate is filled with fruits or vegetables. Choose from fresh, frozen, or canned fruits and vegetables. If eating canned, remember to rinse them before eating. This will remove any excess salt added for packaging. Summary  Eating healthy on a budget is possible if you plan your meals according to your budget, purchase according to your budget and grocery list, and prepare food yourself.  Tips for buying more food on a limited budget include buying generic brands, using coupons only for foods you normally buy, and buying healthy items from the bulk bins when available.  Tips for buying cheaper food to replace expensive food include choosing cheaper, lean cuts of meat, and buying dried beans and  peas. This information is not intended to replace advice given to you by your health care provider. Make sure you discuss any questions you have with your health care provider. Document Revised: 07/14/2020 Document Reviewed: 07/14/2020 Elsevier Patient Education  2021 [place] Following a healthy eating pattern may help you to achieve and maintain a healthy body weight, reduce the risk of chronic disease, and live a long and productive life. It is important to follow a healthy eating pattern at an appropriate calorie level for your body. Your nutritional needs should be met primarily through food by choosing a variety of nutrient-rich foods. What are tips for following this plan? Reading food  labels  Read labels and choose the following: ? Reduced or low sodium. ? Juices with 100% fruit juice. ? Foods with low saturated fats and high polyunsaturated and monounsaturated fats. ? Foods with whole grains, such as whole wheat, cracked wheat, brown rice, and wild rice. ? Whole grains that are fortified with folic acid. This is recommended for women who are pregnant or who want to become pregnant.  Read labels and avoid the following: ? Foods with a lot of added sugars. These include foods that contain brown sugar, corn sweetener, corn syrup, dextrose, fructose, glucose, high-fructose corn syrup, honey, invert sugar, lactose, malt syrup, maltose, molasses, raw sugar, sucrose, trehalose, or turbinado sugar.  Do not eat more than the following amounts of added sugar per day:  6 teaspoons (25 g) for women.  9 teaspoons (38 g) for men. ? Foods that contain processed or refined starches and grains. ? Refined grain products, such as white flour, degermed cornmeal, white bread, and white rice. Shopping  Choose nutrient-rich snacks, such as vegetables, whole fruits, and nuts. Avoid high-calorie and high-sugar snacks, such as potato chips, fruit snacks, and candy.  Use oil-based  dressings and spreads on foods instead of solid fats such as butter, stick margarine, or cream cheese.  Limit pre-made sauces, mixes, and "instant" products such as flavored rice, instant noodles, and ready-made pasta.  Try more plant-protein sources, such as tofu, tempeh, black beans, edamame, lentils, nuts, and seeds.  Explore eating plans such as the Mediterranean diet or vegetarian diet. Cooking  Use oil to saut or stir-fry foods instead of solid fats such as butter, stick margarine, or lard.  Try baking, boiling, grilling, or broiling instead of frying.  Remove the fatty part of meats before cooking.  Steam vegetables in water or broth. Meal planning  At meals, imagine dividing your plate into fourths: ? One-half of your plate is fruits and vegetables. ? One-fourth of your plate is whole grains. ? One-fourth of your plate is protein, especially lean meats, poultry, eggs, tofu, beans, or nuts.  Include low-fat dairy as part of your daily diet.   Lifestyle  Choose healthy options in all settings, including home, work, school, restaurants, or stores.  Prepare your food safely: ? Wash your hands after handling raw meats. ? Keep food preparation surfaces clean by regularly washing with hot, soapy water. ? Keep raw meats separate from ready-to-eat foods, such as fruits and vegetables. ? Cook seafood, meat, poultry, and eggs to the recommended internal temperature. ? Store foods at safe temperatures. In general:  Keep cold foods at 70F (4.4C) or below.  Keep hot foods at 170F (60C) or above.  Keep your freezer at [facility] (-17.8C) or below.  Foods are no longer safe to eat when they have been between the temperatures of 40-170F (4.4-60C) for more than 2 hours. What foods should I eat? Fruits Aim to eat 2 cup-equivalents of fresh, canned (in natural juice), or frozen fruits each day. Examples of 1 cup-equivalent of fruit include 1 small apple, 8 large strawberries, 1 cup  canned fruit,  cup dried fruit, or 1 cup 100% juice. Vegetables Aim to eat 2-3 cup-equivalents of fresh and frozen vegetables each day, including different varieties and colors. Examples of 1 cup-equivalent of vegetables include 2 medium carrots, 2 cups raw, leafy greens, 1 cup chopped vegetable (raw or cooked), or 1 medium baked potato. Grains Aim to eat 6 ounce-equivalents of whole grains each day. Examples of 1 ounce-equivalent of grains include 1  slice of bread, 1 cup ready-to-eat cereal, 3 cups popcorn, or  cup cooked rice, pasta, or cereal. Meats and other proteins Aim to eat 5-6 ounce-equivalents of protein each day. Examples of 1 ounce-equivalent of protein include 1 egg, 1/2 cup nuts or seeds, or 1 tablespoon (16 g) peanut butter. A cut of meat or fish that is the size of a deck of cards is about 3-4 ounce-equivalents.  Of the protein you eat each week, try to have at least 8 ounces come from seafood. This includes salmon, trout, herring, and anchovies. Dairy Aim to eat 3 cup-equivalents of fat-free or low-fat dairy each day. Examples of 1 cup-equivalent of dairy include 1 cup (240 mL) milk, 8 ounces (250 g) yogurt, 1 ounces (44 g) natural cheese, or 1 cup (240 mL) fortified soy milk. Fats and oils  Aim for about 5 teaspoons (21 g) per day. Choose monounsaturated fats, such as canola and olive oils, avocados, peanut butter, and most nuts, or polyunsaturated fats, such as sunflower, corn, and soybean oils, walnuts, pine nuts, sesame seeds, sunflower seeds, and flaxseed. Beverages  Aim for six 8-oz glasses of water per day. Limit coffee to three to five 8-oz cups per day.  Limit caffeinated beverages that have added calories, such as soda and energy drinks.  Limit alcohol intake to no more than 1 drink a day for nonpregnant women and 2 drinks a day for men. One drink equals 12 oz of beer (355 mL), 5 oz of wine (148 mL), or 1 oz of hard liquor (44 mL). Seasoning and other  foods  Avoid adding excess amounts of salt to your foods. Try flavoring foods with herbs and spices instead of salt.  Avoid adding sugar to foods.  Try using oil-based dressings, sauces, and spreads instead of solid fats. This information is based on general [place] nutrition guidelines. For more information, visit BuildDNA.es. Exact amounts may vary based on your nutrition needs. Summary  A healthy eating plan may help you to maintain a healthy weight, reduce the risk of chronic diseases, and stay active throughout your life.  Plan your meals. Make sure you eat the right portions of a variety of nutrient-rich foods.  Try baking, boiling, grilling, or broiling instead of frying.  Choose healthy options in all settings, including home, work, school, restaurants, or stores. This information is not intended to replace advice given to you by your health care provider. Make sure you discuss any questions you have with your health care provider. Document Revised: 01/13/2018 Document Reviewed: 01/13/2018 Elsevier Patient Education  2021 [place].  Hematuria, Adult Hematuria is blood in the urine. Blood may be visible in the urine, or it may be identified with a test. This condition can be caused by infections of the bladder, urethra, kidney, or prostate. Other possible causes include:  Kidney stones.  Cancer of the urinary tract.  Too much calcium in the urine.  Conditions that are passed from parent to child (inherited conditions).  Exercise that requires a lot of energy. Infections can usually be treated with medicine, and a kidney stone usually will pass through your urine. If neither of these is the cause of your hematuria, more tests may be needed to identify the cause of your symptoms. It is very important to tell your health care provider about any blood in your urine, even if it is painless or the blood stops without treatment. Blood in the urine, when it happens and then  stops and then happens again,  can be a symptom of a very serious condition, including cancer. There is no pain in the initial stages of many urinary cancers. Follow these instructions at home: Medicines  Take over-the-counter and prescription medicines only as told by your health care provider.  If you were prescribed an antibiotic medicine, take it as told by your health care provider. Do not stop taking the antibiotic even if you start to feel better. Eating and drinking  Drink enough fluid to keep your urine pale yellow. It is recommended that you drink 3-4 quarts (2.8-3.8 L) a day. If you have been diagnosed with an infection, drinking cranberry juice in addition to large amounts of water is recommended.  Avoid caffeine, tea, and carbonated beverages. These tend to irritate the bladder.  Avoid alcohol because it may irritate the prostate (in males). General instructions  If you have been diagnosed with a kidney stone, follow your health care provider's instructions about straining your urine to catch the stone.  Empty your bladder often. Avoid holding urine for long periods of time.  If you are female: ? After a bowel movement, wipe from front to back and use each piece of toilet paper only once. ? Empty your bladder before and after sex.  Pay attention to any changes in your symptoms. Tell your health care provider about any changes or any new symptoms.  It is up to you to get the results of any tests. Ask your health care provider, or the department that is doing the test, when your results will be ready.  Keep all follow-up visits. This is important. Contact a health care provider if:  You develop back pain.  You have a fever or chills.  You have nausea or vomiting.  Your symptoms do not improve after 3 days.  Your symptoms get worse. Get help right away if:  You develop severe vomiting and are unable to take medicine without vomiting.  You develop severe pain in  your back or abdomen even though you are taking medicine.  You pass a large amount of blood in your urine.  You pass blood clots in your urine.  You feel very weak or like you might faint.  You faint. Summary  Hematuria is blood in the urine. It has many possible causes.  It is very important that you tell your health care provider about any blood in your urine, even if it is painless or the blood stops without treatment.  Take over-the-counter and prescription medicines only as told by your health care provider.  Drink enough fluid to keep your urine pale yellow. This information is not intended to replace advice given to you by your health care provider. Make sure you discuss any questions you have with your health care provider. Document Revised: 06/01/2020 Document Reviewed: 06/01/2020 Elsevier Patient Education  2021 [place]   There is no medication other than over the counter meds: Mucinex dm green label for cough. Vitamin C 1000 mg daily. Vitamin D3 4000 Iu (units) daily. Zinc 100 mg daily. Quercetin 250-500 mg 2 times per day  Monitor pulse ox, buy from [facility] if oxygen is less than 90 please go to the hospital.     Are you feeling really sick? Shortness of breath, cough, chest pain? If so let me know  If worsening, go to hospital.

## 2020-10-29 LAB — URINE CULTURE

## 2020-10-31 LAB — URINE CYTOLOGY ANCILLARY ONLY
Bacterial Vaginitis-Urine: NEGATIVE
Candida Urine: NEGATIVE
Chlamydia: NEGATIVE
Comment: NEGATIVE
Comment: NEGATIVE
Comment: NORMAL
Neisseria Gonorrhea: NEGATIVE
Trichomonas: NEGATIVE

## 2020-11-02 ENCOUNTER — Telehealth: Payer: Self-pay | Admitting: Internal Medicine

## 2020-11-02 NOTE — Telephone Encounter (Signed)
Patient was returning call for results 

## 2020-11-04 NOTE — Telephone Encounter (Signed)
Pasty Spillers McLean-Scocuzza, MD  11/02/2020  4:49 PM EST Back to Top     If your symptoms are better you can not take cipro but if still present you can take this with pyridium and blood pressure meds ok to take together   [name], [facility]  11/02/2020  4:46 PM EST      Patient returned call for labs. Cassandra Cardenas states that she has seen her results online through [employer]. Letter was not needed anymore and has been shredded. Cassandra Cardenas asks if she can take the Ciprofloxacin and Pyridium together with her blood pressure medication.

## 2020-12-20 ENCOUNTER — Ambulatory Visit (INDEPENDENT_AMBULATORY_CARE_PROVIDER_SITE_OTHER): Payer: 59 | Admitting: Internal Medicine

## 2020-12-20 ENCOUNTER — Other Ambulatory Visit: Payer: Self-pay | Admitting: Internal Medicine

## 2020-12-20 ENCOUNTER — Telehealth: Payer: Self-pay | Admitting: Internal Medicine

## 2020-12-20 ENCOUNTER — Encounter: Payer: Self-pay | Admitting: Internal Medicine

## 2020-12-20 ENCOUNTER — Other Ambulatory Visit: Payer: Self-pay

## 2020-12-20 VITALS — BP 118/90 | HR 77 | Temp 98.2°F | Ht 65.0 in | Wt 229.5 lb

## 2020-12-20 DIAGNOSIS — E611 Iron deficiency: Secondary | ICD-10-CM | POA: Diagnosis not present

## 2020-12-20 DIAGNOSIS — Z113 Encounter for screening for infections with a predominantly sexual mode of transmission: Secondary | ICD-10-CM

## 2020-12-20 DIAGNOSIS — Z0184 Encounter for antibody response examination: Secondary | ICD-10-CM

## 2020-12-20 DIAGNOSIS — E538 Deficiency of other specified B group vitamins: Secondary | ICD-10-CM

## 2020-12-20 DIAGNOSIS — Z13818 Encounter for screening for other digestive system disorders: Secondary | ICD-10-CM

## 2020-12-20 DIAGNOSIS — E559 Vitamin D deficiency, unspecified: Secondary | ICD-10-CM | POA: Diagnosis not present

## 2020-12-20 DIAGNOSIS — G4700 Insomnia, unspecified: Secondary | ICD-10-CM | POA: Diagnosis not present

## 2020-12-20 DIAGNOSIS — I10 Essential (primary) hypertension: Secondary | ICD-10-CM | POA: Diagnosis not present

## 2020-12-20 DIAGNOSIS — Z124 Encounter for screening for malignant neoplasm of cervix: Secondary | ICD-10-CM

## 2020-12-20 LAB — COMPREHENSIVE METABOLIC PANEL
ALT: 10 U/L (ref 0–35)
AST: 13 U/L (ref 0–37)
Albumin: 4.3 g/dL (ref 3.5–5.2)
Alkaline Phosphatase: 52 U/L (ref 39–117)
BUN: 11 mg/dL (ref 6–23)
CO2: 28 mEq/L (ref 19–32)
Calcium: 9.5 mg/dL (ref 8.4–10.5)
Chloride: 105 mEq/L (ref 96–112)
Creatinine, Ser: 0.91 mg/dL (ref 0.40–1.20)
GFR: 77.37 mL/min (ref >60.00)
Glucose, Bld: 103 mg/dL — ABNORMAL HIGH (ref 70–99)
Potassium: 4.3 mEq/L (ref 3.5–5.1)
Sodium: 140 mEq/L (ref 135–145)
Total Bilirubin: 0.4 mg/dL (ref 0.2–1.2)
Total Protein: 6.8 g/dL (ref 6.0–8.3)

## 2020-12-20 LAB — CBC WITH DIFFERENTIAL/PLATELET
Basophils Absolute: 0 10*3/uL (ref 0.0–0.1)
Basophils Relative: 0.6 % (ref 0.0–3.0)
Eosinophils Absolute: 0.1 10*3/uL (ref 0.0–0.7)
Eosinophils Relative: 1.5 % (ref 0.0–5.0)
HCT: 38.2 % (ref 36.0–46.0)
Hemoglobin: 12.8 g/dL (ref 12.0–15.0)
Lymphocytes Relative: 28.6 % (ref 12.0–46.0)
Lymphs Abs: 1.9 10*3/uL (ref 0.7–4.0)
MCHC: 33.4 g/dL (ref 30.0–36.0)
MCV: 91 fl (ref 78.0–100.0)
Monocytes Absolute: 0.7 10*3/uL (ref 0.1–1.0)
Monocytes Relative: 10.1 % (ref 3.0–12.0)
Neutro Abs: 3.9 10*3/uL (ref 1.4–7.7)
Neutrophils Relative %: 59.2 % (ref 43.0–77.0)
Platelets: 291 10*3/uL (ref 150.0–400.0)
RBC: 4.2 Mil/uL (ref 3.87–5.11)
RDW: 13.4 % (ref 11.5–15.5)
WBC: 6.6 10*3/uL (ref 4.0–10.5)

## 2020-12-20 LAB — LIPID PANEL
Cholesterol: 179 mg/dL (ref 0–200)
HDL: 68.5 mg/dL (ref >39.00)
LDL Cholesterol: 100 mg/dL — ABNORMAL HIGH (ref 0–99)
NonHDL: 110.97
Total CHOL/HDL Ratio: 3
Triglycerides: 53 mg/dL (ref 0.0–149.0)
VLDL: 10.6 mg/dL (ref 0.0–40.0)

## 2020-12-20 LAB — VITAMIN B12: Vitamin B-12: 213 pg/mL (ref 211–911)

## 2020-12-20 LAB — VITAMIN D 25 HYDROXY (VIT D DEFICIENCY, FRACTURES): VITD: 22.54 ng/mL — ABNORMAL LOW (ref 30.00–100.00)

## 2020-12-20 MED ORDER — TRAZODONE HCL 50 MG PO TABS: 25.0000 mg | ORAL_TABLET | Freq: Every evening | ORAL | 3 refills | Status: DC | PRN

## 2020-12-20 MED ORDER — AMLODIPINE BESYLATE 2.5 MG PO TABS: 2.5000 mg | ORAL_TABLET | Freq: Every day | ORAL | 3 refills | Status: DC

## 2020-12-20 NOTE — Addendum Note (Signed)
Addended by: Quentin Ore on: 12/20/2020 09:50 AM   Modules accepted: Orders

## 2020-12-20 NOTE — Telephone Encounter (Signed)
lft vm for pt to call of[facility] regarding referral. I need to speak with pt. Thanks

## 2020-12-20 NOTE — Progress Notes (Signed)
Chief Complaint  Patient presents with  . Follow-up    6 mo f/u, pt reports weight gain.    F/u  1. HTN sl elevated today took norvasc 2.5 at 7:30 this am not checking BP at home 2. Increased wt she is not exercising as much and diet limited due to work schedule  3. B12, vitamin D and iron def she could not tolerate B12 shot but is taking otc supplements she thinks   Review of Systems  Constitutional: Negative for weight loss.  HENT: Negative for hearing loss.   Eyes: Negative for blurred vision.  Respiratory: Negative for shortness of breath.   Cardiovascular: Negative for chest pain.  Gastrointestinal: Negative for abdominal pain.  Musculoskeletal: Negative for falls and joint pain.  Skin: Negative for rash.  Neurological: Negative for headaches.  Psychiatric/Behavioral: Negative for depression.   Past Medical History:  Diagnosis Date  . Abnormal Pap smear of cervix    lsil 01/06/13 no HPV testing done s/p cervical bx 01/15/13 negative +HPV/chronic cervicitis; pap 04/07/13 lsil HPV + f/u 09/17/13 negative; pap 01/14/14 Lsil HPV +; 08/26/14 negative transformation zone absent; pap 01/19/15 negative absent zone; pap 02/08/16 negative; pap 02/19/17 negative; [facility] 02/27/18 [name]   . GERD (gastroesophageal reflux disease)   . Headache   . HTN (hypertension)    Past Surgical History:  Procedure Laterality Date  . KNEE SURGERY     left knee arthroscopy 44 y.o   . TUBAL LIGATION    . WISDOM TOOTH EXTRACTION     Family History  Problem Relation Age of Onset  . Hypertension Mother   . Cancer Father        ? type mets died in 44s  . Multiple sclerosis Sister   . Cancer Brother        pancreatitic age 58/age 59 12/2020 with mets on chemo  . Breast cancer Neg Hx    Social History   Socioeconomic History  . Marital status: Single    Spouse name: Not on file  . Number of children: Not on file  . Years of education: Not on file  . Highest education level: Not on file   Occupational History  . Not on file  Tobacco Use  . Smoking status: Former Games developer  . Smokeless tobacco: Never Used  Vaping Use  . Vaping Use: Never used  Substance and Sexual Activity  . Alcohol use: Yes    Comment: occ  . Drug use: No  . Sexual activity: Yes  Other Topics Concern  . Not on file  Social History Narrative   Works [employer] pt access    3 kids daughter 40 y.o 2 sons 64 y.o and 50 y.o as of 05/2019    -daughter lives outside of the home    1 cat at home   DPR mother Nakyla Bracco [phone]   Social Determinants of Health   Financial Resource Strain: Not on file  Food Insecurity: Not on file  Transportation Needs: Not on file  Physical Activity: Not on file  Stress: Not on file  Social Connections: Not on file  Intimate Partner Violence: Not on file   Current Meds  Medication Sig  . amLODipine (NORVASC) 2.5 MG tablet Take 1 tablet (2.5 mg total) by mouth daily.  . Cholecalciferol 1.25 MG (50000 UT) capsule Take 1 capsule (50,000 Units total) by mouth once a week. X 6 months then take otc D3 5000 IU daily  . ciprofloxacin (CIPRO) 500  MG tablet Take 1 tablet (500 mg total) by mouth 2 (two) times daily.  Marland Kitchen oxyCODONE-acetaminophen (PERCOCET) 5-325 MG tablet Take 1 tablet by mouth 2 (two) times daily as needed for severe pain.  . phenazopyridine (PYRIDIUM) 200 MG tablet Take 1 tablet (200 mg total) by mouth 3 (three) times daily as needed for pain.  . traZODone (DESYREL) 50 MG tablet Take 0.5-1 tablets (25-50 mg total) by mouth at bedtime as needed for sleep.   Allergies  Allergen Reactions  . Latex Hives   Recent Results (from the past 2160 hour(s))  Urine Culture     Status: None   Collection Time: 10/27/20 10:23 AM   Specimen: Urine   Urine  Result Value Ref Range   Urine Culture, Routine Final report    Organism ID, Bacteria Comment     Comment: Mixed urogenital flora 25,000-50,000 colony forming units per mL   Urine cytology ancillary only([facility])     Status: None   Collection Time: 10/27/20 10:23 AM  Result Value Ref Range   Chlamydia Negative    Neisseria Gonorrhea Negative    Trichomonas Negative    Bacterial Vaginitis-Urine Negative    Candida Urine Negative    Molecular Comment      This specimen does not meet the strict criteria set by the FDA. The   Molecular Comment      result interpretation should be considered in conjunction with the   Molecular Comment patient's clinical history.    Comment Normal Reference Range Trichomonas - Negative    Comment Normal Reference Ranger Chlamydia - Negative    Comment      Normal Reference Range Neisseria Gonorrhea - Negative   Objective  Body mass index is 38.19 kg/m. Wt Readings from Last 3 Encounters:  12/20/20 229 lb 8 oz (104.1 kg)  10/27/20 230 lb 9.6 oz (104.6 kg)  06/22/20 224 lb 6.4 oz (101.8 kg)   Temp Readings from Last 3 Encounters:  12/20/20 98.2 F (36.8 C)  10/27/20 97.7 F (36.5 C) (Oral)  06/22/20 99.2 F (37.3 C) (Oral)   BP Readings from Last 3 Encounters:  12/20/20 118/90  10/27/20 118/82  06/22/20 110/80   Pulse Readings from Last 3 Encounters:  12/20/20 77  10/27/20 77  06/22/20 72    Physical Exam Vitals and nursing note reviewed.  Constitutional:      Appearance: Normal appearance. She is well-developed and well-groomed. She is obese.  HENT:     Head: Normocephalic and atraumatic.  Eyes:     Conjunctiva/sclera: Conjunctivae normal.     Pupils: Pupils are equal, round, and reactive to light.  Cardiovascular:     Rate and Rhythm: Normal rate and regular rhythm.     Heart sounds: Normal heart sounds.  Pulmonary:     Effort: Pulmonary effort is normal.     Breath sounds: Normal breath sounds.  Abdominal:     Tenderness: There is no abdominal tenderness.  Skin:    General: Skin is warm and dry.  Neurological:     General: No focal deficit present.     Mental Status: She is alert and oriented to person, place, and time.  Mental status is at baseline.     Gait: Gait normal.  Psychiatric:        Attention and Perception: Attention and perception normal.        Mood and Affect: Mood and affect normal.        Speech: Speech normal.  Behavior: Behavior normal. Behavior is cooperative.        Thought Content: Thought content normal.        Cognition and Memory: Cognition and memory normal.        Judgment: Judgment normal.     Assessment  Plan  Essential hypertension, benign - Plan: Comprehensive metabolic panel, Lipid panel, CBC w/Diff On norvasc 2.5 can take 2-2.5 if needed if BP >130/>80  Iron deficiency - Plan: Iron, TIBC and Ferritin Panel  Vitamin B12 deficiency - Plan: Vitamin B12 rec otc B12 500 to 1000 mcg qd   Vitamin D deficiency - Plan: Vitamin D (25 hydroxy)  Routine cervical smear   HM Flu shotutd Tdap ? Year covid pfizer had 2/2consider booster   mammosch 11/12/20had repeat US 09/04/19 1.8 x 1.4 x 1.8 left cyst repeat in 1 year -referred1/3/22 b/l cysts  10/17/20 negative   [facility]/o abnormal papper pt had cervical procedure on cervix ? leep or cone bx -pap 02/27/18 normalreferred   Colonoscopy sch age 39 y.o  Check hiv and hep C rec healthy diet and exercise  Provider: Dr. French Ana McLean-Scocuzza-Internal Medicine

## 2020-12-20 NOTE — Patient Instructions (Addendum)
Tdap-call employee health and get the date please   Vitamin B12 500 mcg to 1000 mcg daily   Pap smear due 02/27/2021 call [name] and get schedule and please make sure they fax me copy of pap smear

## 2020-12-20 NOTE — Addendum Note (Signed)
Addended by: Warden Fillers on: 12/20/2020 10:13 AM   Modules accepted: Orders

## 2020-12-21 LAB — IRON,TIBC AND FERRITIN PANEL
%SAT: 12 % (calc) — ABNORMAL LOW (ref 16–45)
Ferritin: 6 ng/mL — ABNORMAL LOW (ref 16–232)
Iron: 44 ug/dL (ref 40–190)
TIBC: 378 mcg/dL (calc) (ref 250–450)

## 2020-12-21 LAB — HEPATITIS C ANTIBODY
Hepatitis C Ab: NONREACTIVE
SIGNAL TO CUT-OFF: 0 (ref <1.00)

## 2020-12-21 LAB — HIV ANTIBODY (ROUTINE TESTING W REFLEX): HIV 1&2 Ab, 4th Generation: NONREACTIVE

## 2020-12-21 LAB — HEPATITIS B SURFACE ANTIBODY, QUANTITATIVE: Hep B S AB Quant (Post): 234 m[IU]/mL (ref >=10)

## 2020-12-21 NOTE — Telephone Encounter (Signed)
lft vm for pt to call [facility] regarding referral. Thanks

## 2020-12-26 ENCOUNTER — Telehealth: Payer: Self-pay | Admitting: Internal Medicine

## 2020-12-26 NOTE — Telephone Encounter (Signed)
Thank you :)

## 2020-12-26 NOTE — Telephone Encounter (Signed)
Patient was returning call about referral 

## 2020-12-26 NOTE — Telephone Encounter (Signed)
lft vm for pt to call [facility] regarding referral to ob/gyn. thanks

## 2021-02-09 ENCOUNTER — Telehealth: Payer: Self-pay | Admitting: Internal Medicine

## 2021-02-09 NOTE — Telephone Encounter (Signed)
lft pt vm to call ofc to follow up on referral to [facility] ob/g[facility]. thanks

## 2021-04-07 ENCOUNTER — Other Ambulatory Visit: Payer: Self-pay

## 2021-04-07 MED FILL — Amlodipine Besylate Tab 2.5 MG (Base Equivalent): ORAL | 90 days supply | Qty: 180 | Fill #0 | Status: AC

## 2021-04-27 ENCOUNTER — Telehealth: Payer: Self-pay | Admitting: Internal Medicine

## 2021-04-27 NOTE — Telephone Encounter (Signed)
Lft pt vm to call ofc to follow up on referral. thanks 

## 2021-05-02 DIAGNOSIS — Z01419 Encounter for gynecological examination (general) (routine) without abnormal findings: Secondary | ICD-10-CM | POA: Diagnosis not present

## 2021-05-02 DIAGNOSIS — Z124 Encounter for screening for malignant neoplasm of cervix: Secondary | ICD-10-CM | POA: Diagnosis not present

## 2021-05-04 LAB — HM PAP SMEAR: HM Pap smear: NORMAL

## 2021-05-25 ENCOUNTER — Encounter: Payer: Self-pay | Admitting: Internal Medicine

## 2021-07-25 ENCOUNTER — Ambulatory Visit: Payer: 59 | Admitting: Internal Medicine

## 2021-07-25 ENCOUNTER — Other Ambulatory Visit: Payer: Self-pay

## 2021-07-25 MED FILL — Amlodipine Besylate Tab 2.5 MG (Base Equivalent): ORAL | 90 days supply | Qty: 180 | Fill #1 | Status: AC

## 2021-08-03 ENCOUNTER — Encounter: Payer: Self-pay | Admitting: Internal Medicine

## 2021-08-03 NOTE — Telephone Encounter (Signed)
Please advise, does the Patient need an appointment?

## 2021-08-07 NOTE — Telephone Encounter (Signed)
Yes please sch appt will need to do PHQ 9 and GAD 7 at appt  New issues needs appt  Thank you

## 2021-08-16 NOTE — Telephone Encounter (Signed)
Pt called in requesting medication for mood swing. Pt stated that everything is making her irritable. Ask Pt if she was suicidal at time of call. Pt stated no. No triage needed. Pt have a upcoming appt on 08/24/2021 with Dr French Ana. Pt stated that she can't wait that long for appt.  Pt would like medication prescribe to her. Pt would like callback at [phone]

## 2021-08-17 ENCOUNTER — Telehealth (INDEPENDENT_AMBULATORY_CARE_PROVIDER_SITE_OTHER): Payer: 59 | Admitting: Internal Medicine

## 2021-08-17 ENCOUNTER — Encounter: Payer: Self-pay | Admitting: Internal Medicine

## 2021-08-17 ENCOUNTER — Other Ambulatory Visit: Payer: Self-pay

## 2021-08-17 VITALS — Ht 65.0 in | Wt 230.0 lb

## 2021-08-17 DIAGNOSIS — F4323 Adjustment disorder with mixed anxiety and depressed mood: Secondary | ICD-10-CM | POA: Diagnosis not present

## 2021-08-17 MED ORDER — FLUOXETINE HCL 20 MG PO TABS
20.0000 mg | ORAL_TABLET | Freq: Every day | ORAL | 3 refills | Status: DC
  Filled 2021-08-17: qty 90, 90d supply, fill #0

## 2021-08-17 NOTE — Progress Notes (Signed)
Virtual Visit via Video Note  I connected with Cassandra Cardenas  on 08/17/21 at  3:25PM EDT by a video enabled telemedicine application and verified that I am speaking with the correct person using two identifiers.  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 The patient expressed understanding and agreed to proceed.   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 She even got irritable/angry at 44 y.o son b/c she had to ask him to take the trash out more than 1 x. In the past 45 y.o was on prozac when grandfather diet  She just likes to stay at home and watch tv   -COVID-19 vaccine status: 2/2  ROS: See pertinent positives and negatives per HPI.  Past Medical History:  Diagnosis Date   Abnormal Pap smear of cervix    lsil 01/06/13 no HPV testing done s/p cervical bx 01/15/13 negative +HPV/chronic cervicitis; pap 04/07/13 lsil HPV + f/u 09/17/13 negative; pap 01/14/14 Lsil HPV +; 08/26/14 negative transformation zone absent; pap 01/19/15 negative absent zone; pap 02/08/16 negative; pap 02/19/17 negative; pap 02/27/18 [name]    GERD (gastroesophageal reflux disease)    Headache    HTN (hypertension)     Past Surgical History:  Procedure Laterality Date   KNEE SURGERY     left knee arthroscopy 45 y.o    TUBAL LIGATION     WISDOM TOOTH EXTRACTION       Current Outpatient Medications:    amLODipine (NORVASC) 2.5 MG tablet, TAKE 1 TO 2  TABLETS BY MOUTH DAILY IF BLOOD PRESSURE NOT <130/<80, Disp: 180 tablet, Rfl: 3   Cholecalciferol 1.25 MG (50000 UT) capsule, Take 1 capsule (50,000 Units total) by mouth once a week. X 6 months then take otc D3 5000 IU daily, Disp: 13 capsule, Rfl: 1   FLUoxetine (PROZAC) 20 MG tablet, Take 1 tablet (20 mg total) by mouth daily., Disp: 90 tablet, Rfl: 3   traZODone (DESYREL) 50 MG tablet, TAKE 0.5-1 TABLETS BY MOUTH AT BEDTIME AS NEEDED FOR SLEEP. (Patient not taking: Reported on 08/17/2021), Disp: 90 tablet, Rfl: 3  EXAM:  VITALS per patient if applicable:  GENERAL: alert, oriented, appears well and in no acute distress  HEENT: atraumatic, conjunttiva clear, no obvious abnormalities on inspection of external nose and ears  NECK: normal movements of the head and neck  LUNGS: on inspection no signs of respiratory distress, breathing rate appears normal, no obvious gross SOB, gasping or wheezing  CV: no obvious cyanosis  MS: moves all visible extremities without noticeable abnormality  PSYCH/NEURO: pleasant and cooperative, no obvious depression or anxiety, speech and thought processing grossly intact  ASSESSMENT AND PLAN:  Discussed the following assessment and plan:  Adjustment disorder with mixed anxiety and depressed mood - Plan: FLUoxetine (PROZAC) 20 MG tablet F/u 4-6 weeks sooner prn  Reduced work schedule and exercise and more self care time   -we discussed possible serious and likely etiologies, options for evaluation and workup,  limitations of telemedicine visit vs in person visit, treatment, treatment risks and precautions.   I discussed the assessment and treatment plan with the patient. The patient was provided an opportunity to ask questions and all were answered. The patient agreed with the plan and demonstrated an understanding of the instructions.    Time spent 20 minutes  Bevelyn Buckles, MD

## 2021-08-17 NOTE — Telephone Encounter (Signed)
No answer, no voicemail.  Appointment available and on hold for Patient at 3:00 pm today. Mychart message sent to call in if can accept appointment. Appointment can be virtual.

## 2021-08-18 ENCOUNTER — Other Ambulatory Visit: Payer: Self-pay

## 2021-08-18 ENCOUNTER — Encounter: Payer: Self-pay | Admitting: Internal Medicine

## 2021-08-18 MED ORDER — FLUOXETINE HCL 20 MG PO CAPS
20.0000 mg | ORAL_CAPSULE | Freq: Every day | ORAL | 3 refills | Status: DC
  Filled 2021-08-18: qty 90, 90d supply, fill #0

## 2021-08-18 NOTE — Addendum Note (Signed)
Addended by: Quentin Ore on: 08/18/2021 10:18 AM   Modules accepted: Orders

## 2021-08-24 ENCOUNTER — Ambulatory Visit: Payer: 59 | Admitting: Internal Medicine

## 2021-09-15 IMAGING — US US BREAST*L* LIMITED INC AXILLA
1 series · 5 of 5 positions shown · non-contrast
Comparison: None

CLINICAL DATA: Patient recalled from screening for left breast
mass.

EXAM:
ULTRASOUND OF THE LEFT BREAST

[Series 1: us breast*left* limited inc axilla · 0.08mm/px · 5 of 5 slices shown]
[im 1/5]
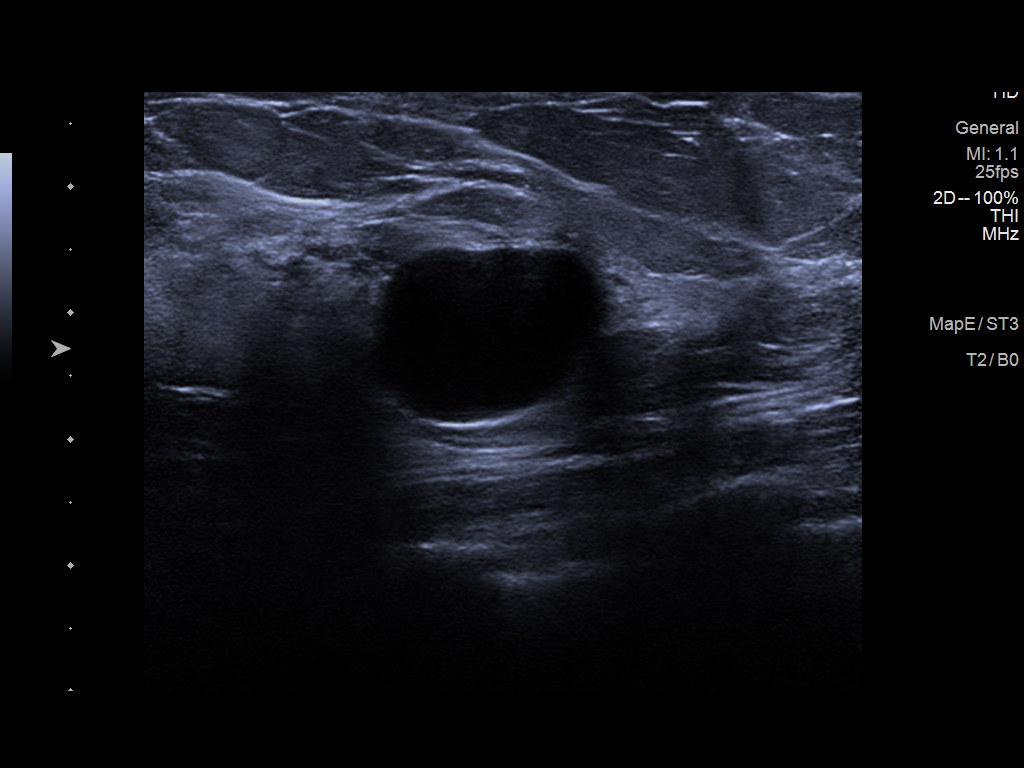
[im 2/5]
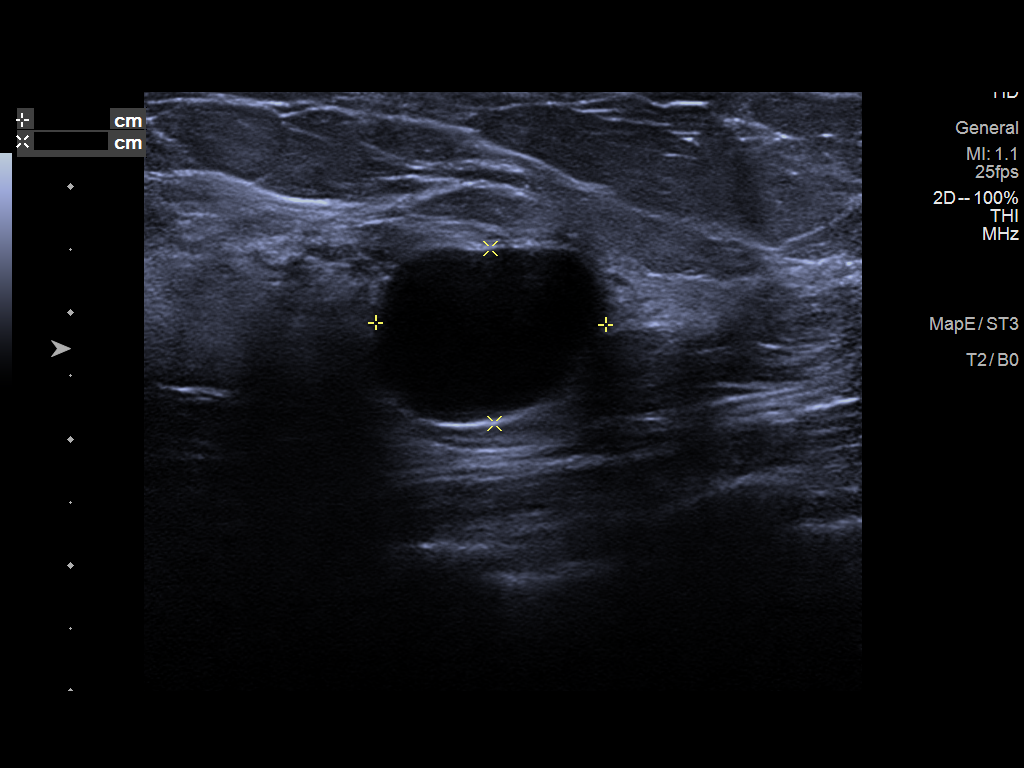
[im 3/5]
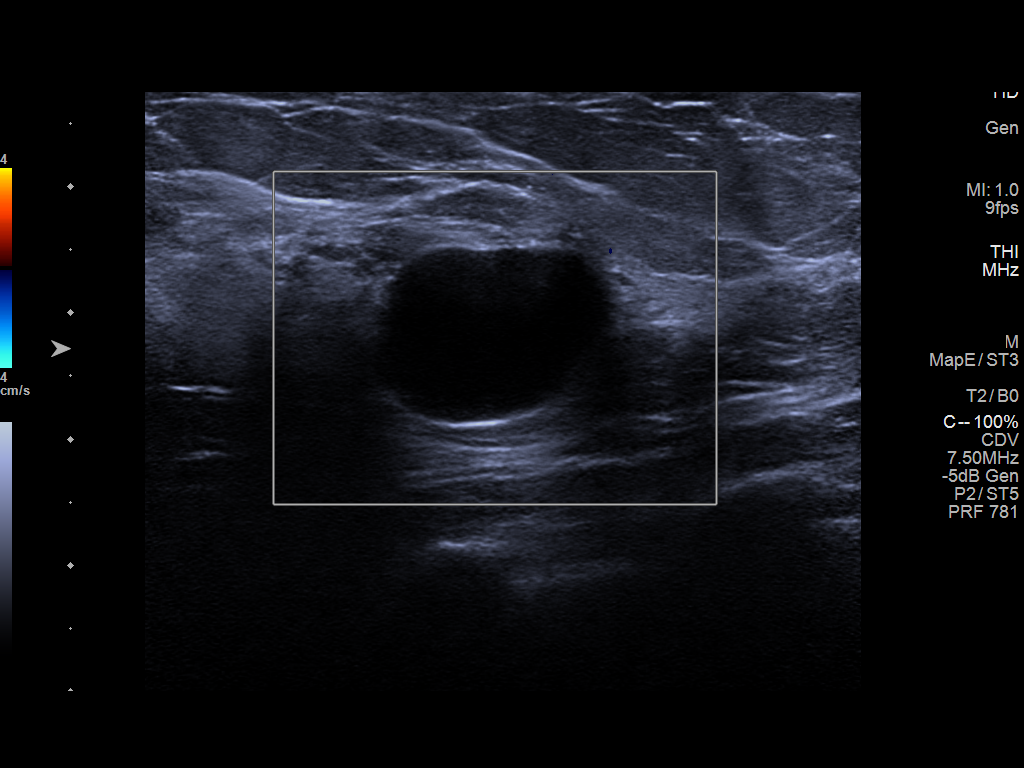
[im 4/5]
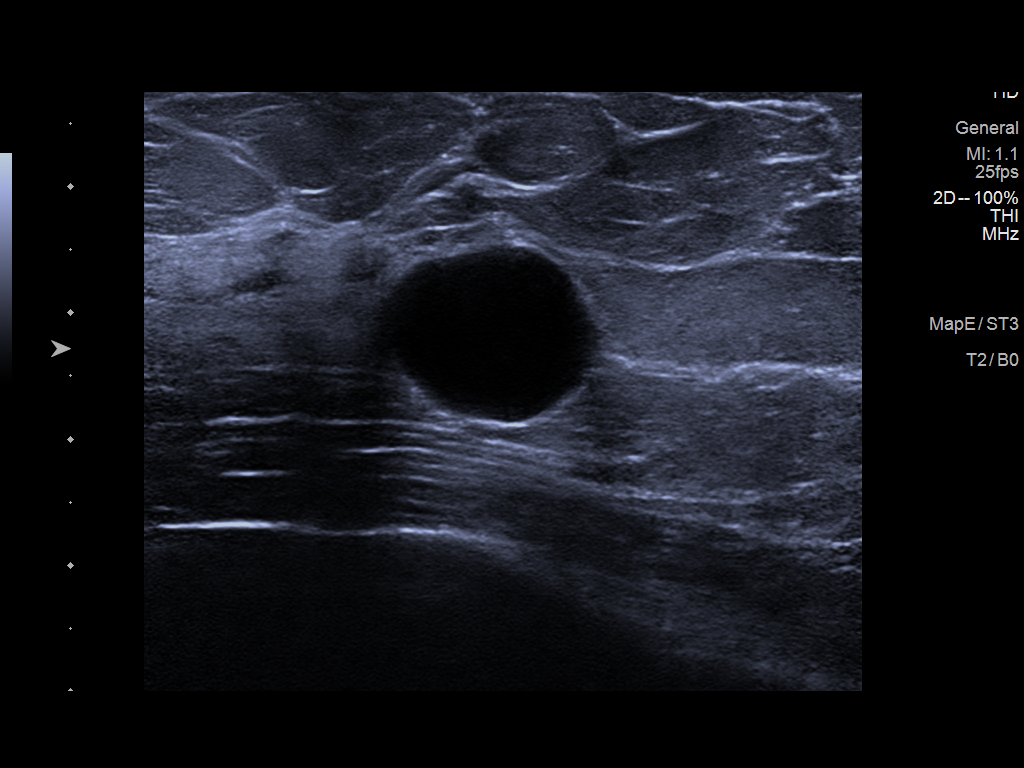
[im 5/5]
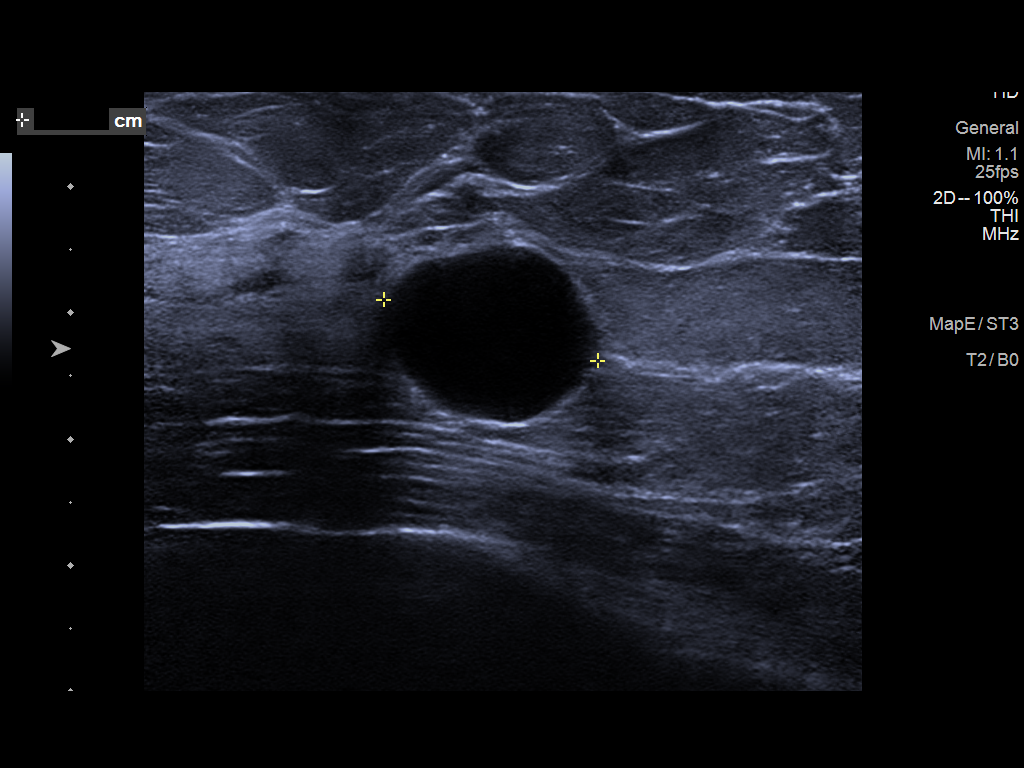

[5 of 5 positions shown; findings below may reference images not displayed]

FINDINGS: Targeted ultrasound is performed, showing a 1.8 x 1.4 x 1 8 cm cyst
left breast 2 o'clock position 4 cm from.
IMPRESSION: Left breast cyst.

RECOMMENDATION:
Screening mammogram in one year.(Code:TC-H-AGK)

I have discussed the findings and recommendations with the patient.
If applicable, a reminder letter will be sent to the patient
regarding the next appointment.

BI-RADS CATEGORY  2: Benign.

## 2021-10-05 ENCOUNTER — Ambulatory Visit: Payer: 59 | Admitting: Internal Medicine

## 2021-10-05 ENCOUNTER — Encounter: Payer: Self-pay | Admitting: Internal Medicine

## 2021-10-05 ENCOUNTER — Other Ambulatory Visit: Payer: Self-pay

## 2021-10-05 ENCOUNTER — Other Ambulatory Visit: Payer: Self-pay | Admitting: Internal Medicine

## 2021-10-05 VITALS — BP 122/80 | HR 91 | Temp 97.1°F | Ht 65.0 in | Wt 218.8 lb

## 2021-10-05 DIAGNOSIS — E876 Hypokalemia: Secondary | ICD-10-CM

## 2021-10-05 DIAGNOSIS — Z1329 Encounter for screening for other suspected endocrine disorder: Secondary | ICD-10-CM

## 2021-10-05 DIAGNOSIS — E559 Vitamin D deficiency, unspecified: Secondary | ICD-10-CM

## 2021-10-05 DIAGNOSIS — Z0000 Encounter for general adult medical examination without abnormal findings: Secondary | ICD-10-CM | POA: Diagnosis not present

## 2021-10-05 DIAGNOSIS — Z1389 Encounter for screening for other disorder: Secondary | ICD-10-CM

## 2021-10-05 DIAGNOSIS — F4321 Adjustment disorder with depressed mood: Secondary | ICD-10-CM

## 2021-10-05 DIAGNOSIS — Z23 Encounter for immunization: Secondary | ICD-10-CM | POA: Diagnosis not present

## 2021-10-05 DIAGNOSIS — E611 Iron deficiency: Secondary | ICD-10-CM

## 2021-10-05 DIAGNOSIS — I10 Essential (primary) hypertension: Secondary | ICD-10-CM | POA: Diagnosis not present

## 2021-10-05 DIAGNOSIS — G4700 Insomnia, unspecified: Secondary | ICD-10-CM | POA: Diagnosis not present

## 2021-10-05 DIAGNOSIS — J40 Bronchitis, not specified as acute or chronic: Secondary | ICD-10-CM

## 2021-10-05 DIAGNOSIS — E538 Deficiency of other specified B group vitamins: Secondary | ICD-10-CM | POA: Diagnosis not present

## 2021-10-05 DIAGNOSIS — J309 Allergic rhinitis, unspecified: Secondary | ICD-10-CM

## 2021-10-05 DIAGNOSIS — Z1231 Encounter for screening mammogram for malignant neoplasm of breast: Secondary | ICD-10-CM | POA: Diagnosis not present

## 2021-10-05 DIAGNOSIS — J9801 Acute bronchospasm: Secondary | ICD-10-CM

## 2021-10-05 LAB — CBC WITH DIFFERENTIAL/PLATELET
Basophils Absolute: 0 10*3/uL (ref 0.0–0.1)
Basophils Relative: 0.4 % (ref 0.0–3.0)
Eosinophils Absolute: 0.2 10*3/uL (ref 0.0–0.7)
Eosinophils Relative: 3.2 % (ref 0.0–5.0)
HCT: 40.7 % (ref 36.0–46.0)
Hemoglobin: 13.5 g/dL (ref 12.0–15.0)
Lymphocytes Relative: 28.2 % (ref 12.0–46.0)
Lymphs Abs: 1.5 10*3/uL (ref 0.7–4.0)
MCHC: 33.1 g/dL (ref 30.0–36.0)
MCV: 91.9 fl (ref 78.0–100.0)
Monocytes Absolute: 0.9 10*3/uL (ref 0.1–1.0)
Monocytes Relative: 17.2 % — ABNORMAL HIGH (ref 3.0–12.0)
Neutro Abs: 2.7 10*3/uL (ref 1.4–7.7)
Neutrophils Relative %: 51 % (ref 43.0–77.0)
Platelets: 251 10*3/uL (ref 150.0–400.0)
RBC: 4.43 Mil/uL (ref 3.87–5.11)
RDW: 13.3 % (ref 11.5–15.5)
WBC: 5.3 10*3/uL (ref 4.0–10.5)

## 2021-10-05 LAB — LIPID PANEL
Cholesterol: 155 mg/dL (ref 0–200)
HDL: 51.7 mg/dL (ref >39.00)
LDL Cholesterol: 92 mg/dL (ref 0–99)
NonHDL: 103.27
Total CHOL/HDL Ratio: 3
Triglycerides: 58 mg/dL (ref 0.0–149.0)
VLDL: 11.6 mg/dL (ref 0.0–40.0)

## 2021-10-05 LAB — IBC + FERRITIN
Ferritin: 12.8 ng/mL (ref 10.0–291.0)
Iron: 38 ug/dL — ABNORMAL LOW (ref 42–145)
Saturation Ratios: 9.9 % — ABNORMAL LOW (ref 20.0–50.0)
TIBC: 385 ug/dL (ref 250.0–450.0)
Transferrin: 275 mg/dL (ref 212.0–360.0)

## 2021-10-05 LAB — VITAMIN D 25 HYDROXY (VIT D DEFICIENCY, FRACTURES): VITD: 17.14 ng/mL — ABNORMAL LOW (ref 30.00–100.00)

## 2021-10-05 LAB — COMPREHENSIVE METABOLIC PANEL
ALT: 18 U/L (ref 0–35)
AST: 20 U/L (ref 0–37)
Albumin: 4.1 g/dL (ref 3.5–5.2)
Alkaline Phosphatase: 45 U/L (ref 39–117)
BUN: 8 mg/dL (ref 6–23)
CO2: 29 mEq/L (ref 19–32)
Calcium: 9.1 mg/dL (ref 8.4–10.5)
Chloride: 103 mEq/L (ref 96–112)
Creatinine, Ser: 0.93 mg/dL (ref 0.40–1.20)
GFR: 74.96 mL/min (ref >60.00)
Glucose, Bld: 101 mg/dL — ABNORMAL HIGH (ref 70–99)
Potassium: 3.2 mEq/L — ABNORMAL LOW (ref 3.5–5.1)
Sodium: 138 mEq/L (ref 135–145)
Total Bilirubin: 0.4 mg/dL (ref 0.2–1.2)
Total Protein: 6.7 g/dL (ref 6.0–8.3)

## 2021-10-05 LAB — URINALYSIS, ROUTINE W REFLEX MICROSCOPIC
Ketones, ur: NEGATIVE
Leukocytes,Ua: NEGATIVE
Nitrite: NEGATIVE
Specific Gravity, Urine: 1.02 (ref 1.000–1.030)
Total Protein, Urine: NEGATIVE
Urine Glucose: NEGATIVE
Urobilinogen, UA: 2 — AB (ref 0.0–1.0)
pH: 6.5 (ref 5.0–8.0)

## 2021-10-05 LAB — VITAMIN B12: Vitamin B-12: 320 pg/mL (ref 211–911)

## 2021-10-05 LAB — TSH: TSH: 2.86 u[IU]/mL (ref 0.35–5.50)

## 2021-10-05 MED ORDER — ALBUTEROL SULFATE HFA 108 (90 BASE) MCG/ACT IN AERS
2.0000 | INHALATION_SPRAY | Freq: Four times a day (QID) | RESPIRATORY_TRACT | 11 refills | Status: DC | PRN
  Filled 2021-10-05: qty 18, 25d supply, fill #0

## 2021-10-05 MED ORDER — AZITHROMYCIN 250 MG PO TABS
ORAL_TABLET | ORAL | 0 refills | Status: AC
  Filled 2021-10-05: qty 6, 5d supply, fill #0

## 2021-10-05 MED ORDER — AMLODIPINE BESYLATE 2.5 MG PO TABS
ORAL_TABLET | ORAL | 3 refills | Status: DC
  Filled 2021-10-05: qty 180, 90d supply, fill #0
  Filled 2022-02-16: qty 180, 90d supply, fill #1

## 2021-10-05 MED ORDER — LEVOCETIRIZINE DIHYDROCHLORIDE 5 MG PO TABS
5.0000 mg | ORAL_TABLET | Freq: Every evening | ORAL | 3 refills | Status: DC | PRN
  Filled 2021-10-05: qty 90, 90d supply, fill #0

## 2021-10-05 MED ORDER — TRAZODONE HCL 50 MG PO TABS
ORAL_TABLET | ORAL | 3 refills | Status: DC
  Filled 2021-10-05: qty 90, 90d supply, fill #0

## 2021-10-05 MED ORDER — POTASSIUM CHLORIDE CRYS ER 20 MEQ PO TBCR
20.0000 meq | EXTENDED_RELEASE_TABLET | Freq: Two times a day (BID) | ORAL | 0 refills | Status: DC
  Filled 2021-10-05: qty 6, 3d supply, fill #0

## 2021-10-05 MED ORDER — CHOLECALCIFEROL 1.25 MG (50000 UT) PO CAPS
50000.0000 [IU] | ORAL_CAPSULE | ORAL | 1 refills | Status: DC
  Filled 2021-10-05: qty 13, 91d supply, fill #0

## 2021-10-05 NOTE — Progress Notes (Signed)
Chief Complaint  Patient presents with   Annual Exam   Annual  Htn controlled on norvasc 2.5 mg qd   Cough x 2 weeks sleeping with window open and fan with dust h/o allergies and inhaler albuterol use in the past with chest pain due to cough 5/10   Grief brother just died pancreatic cancer 09/29/2021 at 47 Having trouble sleeping   Review of Systems  Constitutional:  Negative for weight loss.  HENT:  Negative for hearing loss.   Eyes:  Negative for blurred vision.  Respiratory:  Positive for cough. Negative for shortness of breath.   Cardiovascular:  Negative for chest pain.  Gastrointestinal:  Negative for abdominal pain and blood in stool.  Genitourinary:  Negative for dysuria.  Musculoskeletal:  Negative for falls and joint pain.  Skin:  Negative for rash.  Neurological:  Negative for headaches.  Psychiatric/Behavioral:  Negative for depression. The patient has insomnia.   Past Medical History:  Diagnosis Date   Abnormal Pap smear of cervix    lsil 01/06/13 no HPV testing done s/p cervical bx 01/15/13 negative +HPV/chronic cervicitis; pap 04/07/13 lsil HPV + f/u 09/17/13 negative; pap 01/14/14 Lsil HPV +; 08/26/14 negative transformation zone absent; pap 01/19/15 negative absent zone; pap 02/08/16 negative; pap 02/19/17 negative; pap 02/[facility]/18 [facility]    GERD (gastroesophageal reflux disease)    Headache    HTN (hypertension)    Past Surgical History:  Procedure Laterality Date   KNEE SURGERY     left knee arthroscopy 45 y.o    TUBAL LIGATION     WISDOM TOOTH EXTRACTION     Family History  Problem Relation Age of Onset   Hypertension Mother    Cancer Father        ? type mets died in 69s   Multiple sclerosis Sister    Cancer Brother        pancreatitic age 65/age 11 12/2020 with mets on chemo as of Oct 09, 2023 brother died   Breast cancer Neg Hx    Social History   Socioeconomic History   Marital status: Single    Spouse name: Not on file   Number of children: Not on  file   Years of education: Not on file   Highest education level: Not on file  Occupational History   Not on file  Tobacco Use   Smoking status: Former   Smokeless tobacco: Never  Vaping Use   Vaping Use: Never used  Substance and Sexual Activity   Alcohol use: Yes    Comment: occ   Drug use: No   Sexual activity: Yes  Other Topics Concern   Not on file  Social History Narrative   Works [employer] pt access    3 kids daughter 23 y.o 2 sons 82 y.o and 44 y.o as of 05/2019    -daughter lives outside of the home    1 cat at home   DPR mother Colie Josten [phone]   Social Determinants of Health   Financial Resource Strain: Not on file  Food Insecurity: Not on file  Transportation Needs: Not on file  Physical Activity: Not on file  Stress: Not on file  Social Connections: Not on file  Intimate Partner Violence: Not on file   Current Meds  Medication Sig   albuterol (VENTOLIN HFA) 108 (90 Base) MCG/ACT inhaler Inhale 2 puffs into the lungs every 6 (six) hours as needed for wheezing or shortness of breath.   azithromycin (ZITHROMAX) 250  MG tablet Take 2 tablets on day 1, then 1 tablet daily on days 2 through 5   levocetirizine (XYZAL) 5 MG tablet Take 1 tablet (5 mg total) by mouth at bedtime as needed for allergies.   Allergies  Allergen Reactions   Latex Hives   No results found for this or any previous visit (from the past 2160 hour(s)). Objective  Body mass index is 36.41 kg/m. Wt Readings from Last 3 Encounters:  10/05/21 218 lb 12.8 oz (99.2 kg)  08/17/21 230 lb (104.3 kg)  12/20/20 229 lb 8 oz (104.1 kg)   Temp Readings from Last 3 Encounters:  10/05/21 (!) 97.1 F (36.2 C) (Temporal)  12/20/20 98.2 F (36.8 C)  10/27/20 97.7 F (36.5 C) (Oral)   BP Readings from Last 3 Encounters:  10/05/21 122/80  12/20/20 118/90  10/27/20 118/82   Pulse Readings from Last 3 Encounters:  10/05/21 91  12/20/20 77  10/27/20 77    Physical Exam Vitals and nursing  note reviewed.  Constitutional:      Appearance: Normal appearance. She is well-developed and well-groomed.  HENT:     Head: Normocephalic and atraumatic.  Eyes:     Conjunctiva/sclera: Conjunctivae normal.     Pupils: Pupils are equal, round, and reactive to light.  Cardiovascular:     Rate and Rhythm: Normal rate and regular rhythm.     Heart sounds: Normal heart sounds. No murmur heard. Pulmonary:     Effort: Pulmonary effort is normal.     Breath sounds: Normal breath sounds.  Abdominal:     General: Abdomen is flat. Bowel sounds are normal.     Tenderness: There is no abdominal tenderness.  Musculoskeletal:        General: No tenderness.  Skin:    General: Skin is warm and dry.  Neurological:     General: No focal deficit present.     Mental Status: She is alert and oriented to person, place, and time. Mental status is at baseline.     Cranial Nerves: Cranial nerves 2-12 are intact.     Gait: Gait is intact.  Psychiatric:        Attention and Perception: Attention and perception normal.        Mood and Affect: Mood and affect normal.        Speech: Speech normal.        Behavior: Behavior normal. Behavior is cooperative.        Thought Content: Thought content normal.        Cognition and Memory: Cognition and memory normal.        Judgment: Judgment normal.    Assessment  Plan  Annual physical exam See below  Grief Will reach out to hospice if counseling needed  Insomnia, unspecified type - Plan: traZODone (DESYREL) 25-50 MG tablet  Essential hypertension controlled- Plan: amLODipine (NORVASC) 2.5 MG tablet, Comprehensive metabolic panel, Lipid panel, CBC w/Diff  Iron deficiency - Plan: IBC + Ferritin  Vitamin D deficiency - Plan: Vitamin D (25 hydroxy)  B12 deficiency - Plan: Vitamin B12  Bronchitis - Plan: azithromycin (ZITHROMAX) 250 MG tablet, albuterol (VENTOLIN HFA) 108 (90 Base) MCG/ACT inhaler Bronchospasm - Plan: albuterol (VENTOLIN HFA) 108 (90  Base) MCG/ACT inhaler Allergic rhinitis, unspecified seasonality, unspecified trigger - Plan: levocetirizine (XYZAL) 5 MG tablet    HM Flu shot utd Tdap given today covid pfizer had 2/2 consider booster    mammo sch 08/27/19 had repeat US 09/04/19 1.8 x 1.4 x  1.8 left cyst repeat in 1 year  -referred 10/17/20 b/l cysts   10/17/20 negative ordered    [facility] h/o abnormal pap per pt had cervical procedure on cervix ? leep or cone bx -pap 02/27/18 normal referred  ROI   Colonoscopy sch age 67 y.o   Immune hep B Neg hiv and hep C rec healthy diet and exercise    Provider: Dr. French Ana McLean-Scocuzza-Internal Medicine

## 2021-10-05 NOTE — Addendum Note (Signed)
Addended by: Tilford Pillar on: 10/05/2021 08:31 AM   Modules accepted: Orders

## 2021-10-05 NOTE — Patient Instructions (Addendum)
Mucinex dm green label  Call for mammogram 10/17/21  Manuka honey  Warm tea hone and lemon  Xyzal  Cough, Adult Coughing is a reflex that clears your throat and your airways (respiratory system). Coughing helps to heal and protect your lungs. It is normal to cough occasionally, but a cough that happens with other symptoms or lasts a long time may be a sign of a condition that needs treatment. An acute cough may only last 2-3 weeks, while a chronic cough may last 8 or more weeks. Coughing is commonly caused by: Infection of the respiratory systemby viruses or bacteria. Breathing in substances that irritate your lungs. Allergies. Asthma. Mucus that runs down the back of your throat (postnasal drip). Smoking. Acid backing up from the stomach into the esophagus (gastroesophageal reflux). Certain medicines. Chronic lung problems. Other medical conditions such as heart failure or a blood clot in the lung (pulmonary embolism). Follow these instructions at home: Medicines Take over-the-counter and prescription medicines only as told by your health care provider. Talk with your health care provider before you take a cough suppressant medicine. Lifestyle  Avoid cigarette smoke. Do not use any products that contain nicotine or tobacco, such as cigarettes, e-cigarettes, and chewing tobacco. If you need help quitting, ask your health care provider. Drink enough fluid to keep your urine pale yellow. Avoid caffeine. Do not drink alcohol if your health care provider tells you not to drink. General instructions  Pay close attention to changes in your cough. Tell your health care provider about them. Always cover your mouth when you cough. Avoid things that make you cough, such as perfume, candles, cleaning products, or campfire or tobacco smoke. If the air is dry, use a cool mist vaporizer or humidifier in your bedroom or your home to help loosen secretions. If your cough is worse at night, try to  sleep in a semi-upright position. Rest as needed. Keep all follow-up visits as told by your health care provider. This is important. Contact a health care provider if you: Have new symptoms. Cough up pus. Have a cough that does not get better after 2-3 weeks or gets worse. Cannot control your cough with cough suppressant medicines and you are losing sleep. Have pain that gets worse or pain that is not helped with medicine. Have a fever. Have unexplained weight loss. Have night sweats. Get help right away if: You cough up blood. You have difficulty breathing. Your heartbeat is very fast. These symptoms may represent a serious problem that is an emergency. Do not wait to see if the symptoms will go away. Get medical help right away. Call your local emergency services (911 in the [place] Do not drive yourself to the hospital. Summary Coughing is a reflex that clears your throat and your airways. It is normal to cough occasionally, but a cough that happens with other symptoms or lasts a long time may be a sign of a condition that needs treatment. Take over-the-counter and prescription medicines only as told by your health care provider. Always cover your mouth when you cough. Contact a health care provider if you have new symptoms or a cough that does not get better after 2-3 weeks or gets worse. This information is not intended to replace advice given to you by your health care provider. Make sure you discuss any questions you have with your health care provider. Document Revised: 10/20/2018 Document Reviewed: 10/20/2018 Elsevier Patient Education  2022 [employer].

## 2021-10-24 ENCOUNTER — Other Ambulatory Visit: Payer: Self-pay

## 2021-10-27 ENCOUNTER — Encounter: Payer: Self-pay | Admitting: Internal Medicine

## 2022-02-16 ENCOUNTER — Encounter: Payer: Self-pay | Admitting: Internal Medicine

## 2022-02-16 ENCOUNTER — Ambulatory Visit (INDEPENDENT_AMBULATORY_CARE_PROVIDER_SITE_OTHER): Payer: 59 | Admitting: Internal Medicine

## 2022-02-16 ENCOUNTER — Ambulatory Visit (INDEPENDENT_AMBULATORY_CARE_PROVIDER_SITE_OTHER): Payer: 59

## 2022-02-16 ENCOUNTER — Other Ambulatory Visit: Payer: Self-pay

## 2022-02-16 ENCOUNTER — Other Ambulatory Visit: Payer: Self-pay | Admitting: Internal Medicine

## 2022-02-16 VITALS — BP 118/74 | HR 78 | Temp 98.5°F | Resp 14 | Ht 65.0 in | Wt 224.6 lb

## 2022-02-16 DIAGNOSIS — E611 Iron deficiency: Secondary | ICD-10-CM | POA: Diagnosis not present

## 2022-02-16 DIAGNOSIS — M5441 Lumbago with sciatica, right side: Secondary | ICD-10-CM

## 2022-02-16 DIAGNOSIS — E559 Vitamin D deficiency, unspecified: Secondary | ICD-10-CM

## 2022-02-16 DIAGNOSIS — M5442 Lumbago with sciatica, left side: Secondary | ICD-10-CM

## 2022-02-16 DIAGNOSIS — I10 Essential (primary) hypertension: Secondary | ICD-10-CM

## 2022-02-16 DIAGNOSIS — N3000 Acute cystitis without hematuria: Secondary | ICD-10-CM

## 2022-02-16 DIAGNOSIS — M545 Low back pain, unspecified: Secondary | ICD-10-CM | POA: Diagnosis not present

## 2022-02-16 DIAGNOSIS — N644 Mastodynia: Secondary | ICD-10-CM

## 2022-02-16 DIAGNOSIS — N6009 Solitary cyst of unspecified breast: Secondary | ICD-10-CM | POA: Diagnosis not present

## 2022-02-16 LAB — COMPREHENSIVE METABOLIC PANEL
ALT: 13 U/L (ref 0–35)
AST: 15 U/L (ref 0–37)
Albumin: 4.4 g/dL (ref 3.5–5.2)
Alkaline Phosphatase: 50 U/L (ref 39–117)
BUN: 9 mg/dL (ref 6–23)
CO2: 25 mEq/L (ref 19–32)
Calcium: 9 mg/dL (ref 8.4–10.5)
Chloride: 104 mEq/L (ref 96–112)
Creatinine, Ser: 0.82 mg/dL (ref 0.40–1.20)
GFR: 86.96 mL/min (ref >60.00)
Glucose, Bld: 90 mg/dL (ref 70–99)
Potassium: 4.2 mEq/L (ref 3.5–5.1)
Sodium: 137 mEq/L (ref 135–145)
Total Bilirubin: 0.6 mg/dL (ref 0.2–1.2)
Total Protein: 6.9 g/dL (ref 6.0–8.3)

## 2022-02-16 LAB — CBC WITH DIFFERENTIAL/PLATELET
Basophils Absolute: 0 10*3/uL (ref 0.0–0.1)
Basophils Relative: 0.5 % (ref 0.0–3.0)
Eosinophils Absolute: 0.1 10*3/uL (ref 0.0–0.7)
Eosinophils Relative: 2.2 % (ref 0.0–5.0)
HCT: 39.3 % (ref 36.0–46.0)
Hemoglobin: 12.9 g/dL (ref 12.0–15.0)
Lymphocytes Relative: 27.7 % (ref 12.0–46.0)
Lymphs Abs: 1.8 10*3/uL (ref 0.7–4.0)
MCHC: 32.7 g/dL (ref 30.0–36.0)
MCV: 92.2 fl (ref 78.0–100.0)
Monocytes Absolute: 0.6 10*3/uL (ref 0.1–1.0)
Monocytes Relative: 9 % (ref 3.0–12.0)
Neutro Abs: 3.9 10*3/uL (ref 1.4–7.7)
Neutrophils Relative %: 60.6 % (ref 43.0–77.0)
Platelets: 301 10*3/uL (ref 150.0–400.0)
RBC: 4.26 Mil/uL (ref 3.87–5.11)
RDW: 13.1 % (ref 11.5–15.5)
WBC: 6.5 10*3/uL (ref 4.0–10.5)

## 2022-02-16 LAB — IBC + FERRITIN
Ferritin: 8.9 ng/mL — ABNORMAL LOW (ref 10.0–291.0)
Iron: 94 ug/dL (ref 42–145)
Saturation Ratios: 22.7 % (ref 20.0–50.0)
TIBC: 414.4 ug/dL (ref 250.0–450.0)
Transferrin: 296 mg/dL (ref 212.0–360.0)

## 2022-02-16 LAB — LIPID PANEL
Cholesterol: 192 mg/dL (ref 0–200)
HDL: 67.4 mg/dL (ref >39.00)
LDL Cholesterol: 109 mg/dL — ABNORMAL HIGH (ref 0–99)
NonHDL: 124.89
Total CHOL/HDL Ratio: 3
Triglycerides: 81 mg/dL (ref 0.0–149.0)
VLDL: 16.2 mg/dL (ref 0.0–40.0)

## 2022-02-16 LAB — VITAMIN D 25 HYDROXY (VIT D DEFICIENCY, FRACTURES): VITD: 18.79 ng/mL — ABNORMAL LOW (ref 30.00–100.00)

## 2022-02-16 MED ORDER — METHYLPREDNISOLONE ACETATE 40 MG/ML IJ SUSP
40.0000 mg | Freq: Once | INTRAMUSCULAR | Status: AC
  Administered 2022-02-16: 40 mg via INTRAMUSCULAR

## 2022-02-16 MED ORDER — CHOLECALCIFEROL 1.25 MG (50000 UT) PO CAPS
50000.0000 [IU] | ORAL_CAPSULE | ORAL | 1 refills | Status: DC
  Filled 2022-02-16: qty 13, 91d supply, fill #0

## 2022-02-16 MED ORDER — TIZANIDINE HCL 4 MG PO TABS
4.0000 mg | ORAL_TABLET | Freq: Every evening | ORAL | 2 refills | Status: DC | PRN
  Filled 2022-02-16: qty 30, 30d supply, fill #0

## 2022-02-16 NOTE — Progress Notes (Signed)
Chief Complaint  ?Patient presents with  ? Acute Visit  ?  Pt c/o lower back pain, leg pain & frequency x1 wk. Pt denies any falls or injuries to back. Leg pain comes and goes. She is having to urinate 2-3 times daily denies any pain or burning.    ? ?F/u  ?1. Right low back pain x 1 week radiates b/l legs comes and goes 2-3 x at night or with movements changed to standing desk  ?She does have h/o kidney stones  ? ?2. Htn controlled on norvasc 2.5 mg qd  ?3. Iron def not taking iron rec  ?4. Mild C/o crown h/a today she is not sleeping drinking a lot of soda  ?5. C/o urinary freq c/w uti no burning ? ?Review of Systems  ?Constitutional:  Negative for weight loss.  ?HENT:  Negative for hearing loss.   ?Eyes:  Negative for blurred vision.  ?Respiratory:  Negative for shortness of breath.   ?Cardiovascular:  Negative for chest pain.  ?Gastrointestinal:  Negative for abdominal pain and blood in stool.  ?Genitourinary:  Positive for frequency. Negative for dysuria.  ?Musculoskeletal:  Positive for back pain. Negative for falls.  ?Skin:  Negative for rash.  ?Neurological:  Positive for headaches.  ?Psychiatric/Behavioral:  Negative for depression.   ?Past Medical History:  ?Diagnosis Date  ? Abnormal Pap smear of cervix   ? lsil 01/06/13 no HPV testing done s/p cervical bx 01/15/13 negative +HPV/chronic cervicitis; pap 04/07/13 lsil HPV + f/u 09/17/13 negative; pap 01/14/14 Lsil HPV +; 08/26/14 negative transformation zone absent; pap 01/19/15 negative absent zone; pap 02/08/16 negative; pap 02/19/17 negative; [facility] 02/27/18 [name]   ? GERD (gastroesophageal reflux disease)   ? Headache   ? HTN (hypertension)   ? ?Past Surgical History:  ?Procedure Laterality Date  ? KNEE SURGERY    ? left knee arthroscopy 46 y.o   ? TUBAL LIGATION    ? WISDOM TOOTH EXTRACTION    ? ?Family History  ?Problem Relation Age of Onset  ? Hypertension Mother   ? Cancer Father   ?     ? type mets died in 43s  ? Multiple sclerosis Sister   ?  Cancer Brother   ?     pancreatitic age 21/age 69 12/2020 with mets on chemo as of Oct 24, 2023 brother died  ? Breast cancer Neg Hx   ? ?Social History  ? ?Socioeconomic History  ? Marital status: Single  ?  Spouse name: Not on file  ? Number of children: Not on file  ? Years of education: Not on file  ? Highest education level: Not on file  ?Occupational History  ? Not on file  ?Tobacco Use  ? Smoking status: Former  ? Smokeless tobacco: Never  ?Vaping Use  ? Vaping Use: Never used  ?Substance and Sexual Activity  ? Alcohol use: Yes  ?  Comment: occ  ? Drug use: No  ? Sexual activity: Yes  ?Other Topics Concern  ? Not on file  ?Social History Narrative  ? Works [employer] pt access   ? 3 kids daughter 41 y.o 2 sons 107 y.o and 110 y.o as of 05/2019   ? -daughter lives outside of the home   ? 1 cat at home  ? DPR mother Lakita Sahlin [phone]  ? ?Social Determinants of Health  ? ?Financial Resource Strain: Not on file  ?Food Insecurity: Not on file  ?Transportation Needs: Not on file  ?Physical  Activity: Not on file  ?Stress: Not on file  ?Social Connections: Not on file  ?Intimate Partner Violence: Not on file  ? ?Current Meds  ?Medication Sig  ? albuterol (VENTOLIN HFA) 108 (90 Base) MCG/ACT inhaler Inhale 2 puffs into the lungs every 6 (six) hours as needed for wheezing or shortness of breath.  ? amLODipine (NORVASC) 2.5 MG tablet TAKE 1 TO 2 TABLETS BY MOUTH DAILY IF BLOOD PRESSURE NOT <130/<80  ? Cholecalciferol 1.25 MG (50000 UT) capsule Take 1 capsule (50,000 Units total) by mouth once a week. X 6 months then take otc D3 5000 IU daily  ? levocetirizine (XYZAL) 5 MG tablet Take 1 tablet (5 mg total) by mouth at bedtime as needed for allergies.  ? potassium chloride SA (KLOR-CON M) 20 MEQ tablet Take 1 tablet (20 mEq total) by mouth 2 (two) times daily. X 3 days  ? tiZANidine (ZANAFLEX) 4 MG tablet Take 1 tablet (4 mg total) by mouth at bedtime as needed for muscle spasms.  ? traZODone (DESYREL) 50 MG tablet TAKE 0.5-1  TABLETS BY MOUTH AT BEDTIME AS NEEDED FOR SLEEP.  ? ?Current Facility-Administered Medications for the 02/16/22 encounter (Office Visit) with McLean-Scocuzza, Pasty Spillersracy N, MD  ?Medication  ? methylPREDNISolone acetate (DEPO-MEDROL) injection 40 mg  ? ?Allergies  ?Allergen Reactions  ? Latex Hives  ? ?No results found for this or any previous visit (from the past 2160 hour(s)). ?Objective  ?Body mass index is 37.38 kg/m?. ?Wt Readings from Last 3 Encounters:  ?02/16/22 224 lb 9.6 oz (101.9 kg)  ?10/05/21 218 lb 12.8 oz (99.2 kg)  ?08/17/21 230 lb (104.3 kg)  ? ?Temp Readings from Last 3 Encounters:  ?02/16/22 98.5 ?F (36.9 ?C) (Oral)  ?10/05/21 (!) 97.1 ?F (36.2 ?C) (Temporal)  ?12/20/20 98.2 ?F (36.8 ?C)  ? ?BP Readings from Last 3 Encounters:  ?02/16/22 118/74  ?10/05/21 122/80  ?12/20/20 118/90  ? ?Pulse Readings from Last 3 Encounters:  ?02/16/22 78  ?10/05/21 91  ?12/20/20 77  ? ? ?Physical Exam ?Vitals and nursing note reviewed.  ?Constitutional:   ?   Appearance: Normal appearance. She is well-developed and well-groomed.  ?HENT:  ?   Head: Normocephalic and atraumatic.  ?Eyes:  ?   Conjunctiva/sclera: Conjunctivae normal.  ?   Pupils: Pupils are equal, round, and reactive to light.  ?Cardiovascular:  ?   Rate and Rhythm: Normal rate and regular rhythm.  ?   Heart sounds: Normal heart sounds. No murmur heard. ?Pulmonary:  ?   Effort: Pulmonary effort is normal.  ?   Breath sounds: Normal breath sounds.  ?Abdominal:  ?   General: Abdomen is flat. Bowel sounds are normal.  ?   Tenderness: There is no abdominal tenderness.  ?Musculoskeletal:  ?   Lumbar back: Tenderness present. Negative right straight leg raise test and negative left straight leg raise test.  ?   Comments: Right low back moderate ttp  ?Skin: ?   General: Skin is warm and dry.  ?Neurological:  ?   General: No focal deficit present.  ?   Mental Status: She is alert and oriented to person, place, and time. Mental status is at baseline.  ?   Cranial  Nerves: Cranial nerves 2-12 are intact.  ?   Motor: Motor function is intact.  ?   Coordination: Coordination is intact.  ?   Gait: Gait is intact.  ?Psychiatric:     ?   Attention and Perception: Attention and perception normal.     ?  Mood and Affect: Mood and affect normal.     ?   Speech: Speech normal.     ?   Behavior: Behavior normal. Behavior is cooperative.     ?   Thought Content: Thought content normal.     ?   Cognition and Memory: Cognition and memory normal.     ?   Judgment: Judgment normal.  ? ? ?Assessment  ?Plan  ?Acute cystitis without hematuria - Plan: Urinalysis, Routine w reflex microscopic, Urine Culture ? ?Acute right-sided low back pain with bilateral sciatica - Plan: DG Lumbar Spine Complete, Ambulatory referral to Physical Therapy [facility], methylPREDNISolone acetate (DEPO-MEDROL) injection 40 mg, tiZANidine (ZANAFLEX) 4 MG tablet ? ?Cyst of breast, 9-10 oclock- Plan: MM DIAG BREAST TOMO BILATERAL, US BREAST LTD UNI RIGHT INC AXILLA ? ?Breast pain - Plan: MM DIAG BREAST TOMO BILATERAL, US BREAST LTD UNI RIGHT INC AXILLA ? ?Iron deficiency - Plan: CBC w/Diff, IBC + Ferritin ?Rec take prenatal vitamin daily with iron ? ?Hypertension, controlled norvasc 2.5 mg bid - Plan: Comprehensive metabolic panel, Lipid panel, CBC w/Diff ? ?Vitamin D deficiency - Plan: Vitamin D (25 hydroxy)  ? ?HM ?Flu shot utd ?Tdap utd ?covid pfizer had 2/2 consider booster  ?  ?mammo sch 08/27/19 had repeat US 09/04/19 1.8 x 1.4 x 1.8 left cyst repeat in 1 year  ?-referred 10/17/20 b/l cysts  ? 10/17/20 negative ordered  ?  ?[facility] h/o abnormal pap per pt had cervical procedure on cervix ? leep or cone bx ?-pap 02/27/18 normal referred  ?ROI pap 05/02/21 normal ?  ?Colonoscopy sch age 27 y.o  ?  ?Immune hep B ?Neg hiv and hep C ?rec healthy diet and exercise  ?  ?Provider: Dr. French Ana McLean-Scocuzza-Internal Medicine  ?

## 2022-02-16 NOTE — Patient Instructions (Addendum)
Excedrin for headaches  ?Tylenol +caffeine  ?Reduce caffeine  ?Reduce sugar ?Magnesium 250 mg daily  ? ?Low Back Sprain or Strain Rehab ?Ask your health care provider which exercises are safe for you. Do exercises exactly as told by your health care provider and adjust them as directed. It is normal to feel mild stretching, pulling, tightness, or discomfort as you do these exercises. Stop right away if you feel sudden pain or your pain gets worse. Do not begin these exercises until told by your health care provider. ?Stretching and range-of-motion exercises ?These exercises warm up your muscles and joints and improve the movement and flexibility of your back. These exercises also help to relieve pain, numbness, and tingling. ?Lumbar rotation ? ?Lie on your back on a firm bed or the floor with your knees bent. ?Straighten your arms out to your sides so each arm forms a 90-degree angle (right angle) with a side of your body. ?Slowly move (rotate) both of your knees to one side of your body until you feel a stretch in your lower back (lumbar). Try not to let your shoulders lift off the floor. ?Hold this position for __________ seconds. ?Tense your abdominal muscles and slowly move your knees back to the starting position. ?Repeat this exercise on the other side of your body. ?Repeat __________ times. Complete this exercise __________ times a day. ?Single knee to chest ? ?Lie on your back on a firm bed or the floor with both legs straight. ?Bend one of your knees. Use your hands to move your knee up toward your chest until you feel a gentle stretch in your lower back and buttock. ?Hold your leg in this position by holding on to the front of your knee. ?Keep your other leg as straight as possible. ?Hold this position for __________ seconds. ?Slowly return to the starting position. ?Repeat with your other leg. ?Repeat __________ times. Complete this exercise __________ times a day. ?Prone extension on elbows ? ?Lie on  your abdomen on a firm bed or the floor (prone position). ?Prop yourself up on your elbows. ?Use your arms to help lift your chest up until you feel a gentle stretch in your abdomen and your lower back. ?This will place some of your body weight on your elbows. If this is uncomfortable, try stacking pillows under your chest. ?Your hips should stay down, against the surface that you are lying on. Keep your hip and back muscles relaxed. ?Hold this position for __________ seconds. ?Slowly relax your upper body and return to the starting position. ?Repeat __________ times. Complete this exercise __________ times a day. ?Strengthening exercises ?These exercises build strength and endurance in your back. Endurance is the ability to use your muscles for a long time, even after they get tired. ?Pelvic tilt ?This exercise strengthens the muscles that lie deep in the abdomen. ?Lie on your back on a firm bed or the floor with your legs extended. ?Bend your knees so they are pointing toward the ceiling and your feet are flat on the floor. ?Tighten your lower abdominal muscles to press your lower back against the floor. This motion will tilt your pelvis so your tailbone points up toward the ceiling instead of pointing to your feet or the floor. ?To help with this exercise, you may place a small towel under your lower back and try to push your back into the towel. ?Hold this position for __________ seconds. ?Let your muscles relax completely before you repeat this exercise. ?Repeat __________  times. Complete this exercise __________ times a day. ?Alternating arm and leg raises ? ?Get on your hands and knees on a firm surface. If you are on a hard floor, you may want to use padding, such as an exercise mat, to cushion your knees. ?Line up your arms and legs. Your hands should be directly below your shoulders, and your knees should be directly below your hips. ?Lift your left leg behind you. At the same time, raise your right arm  and straighten it in front of you. ?Do not lift your leg higher than your hip. ?Do not lift your arm higher than your shoulder. ?Keep your abdominal and back muscles tight. ?Keep your hips facing the ground. ?Do not arch your back. ?Keep your balance carefully, and do not hold your breath. ?Hold this position for __________ seconds. ?Slowly return to the starting position. ?Repeat with your right leg and your left arm. ?Repeat __________ times. Complete this exercise __________ times a day. ?Abdominal set with straight leg raise ? ?Lie on your back on a firm bed or the floor. ?Bend one of your knees and keep your other leg straight. ?Tense your abdominal muscles and lift your straight leg up, 4-6 inches (10-15 cm) off the ground. ?Keep your abdominal muscles tight and hold this position for __________ seconds. ?Do not hold your breath. ?Do not arch your back. Keep it flat against the ground. ?Keep your abdominal muscles tense as you slowly lower your leg back to the starting position. ?Repeat with your other leg. ?Repeat __________ times. Complete this exercise __________ times a day. ?Single leg lower with bent knees ?Lie on your back on a firm bed or the floor. ?Tense your abdominal muscles and lift your feet off the floor, one foot at a time, so your knees and hips are bent in 90-degree angles (right angles). ?Your knees should be over your hips and your lower legs should be parallel to the floor. ?Keeping your abdominal muscles tense and your knee bent, slowly lower one of your legs so your toe touches the ground. ?Lift your leg back up to return to the starting position. ?Do not hold your breath. ?Do not let your back arch. Keep your back flat against the ground. ?Repeat with your other leg. ?Repeat __________ times. Complete this exercise __________ times a day. ?Posture and body mechanics ?Good posture and healthy body mechanics can help to relieve stress in your body's tissues and joints. Body mechanics  refers to the movements and positions of your body while you do your daily activities. Posture is part of body mechanics. Good posture means: ?Your spine is in its natural S-curve position (neutral). ?Your shoulders are pulled back slightly. ?Your head is not tipped forward (neutral). ?Follow these guidelines to improve your posture and body mechanics in your everyday activities. ?Standing ? ?When standing, keep your spine neutral and your feet about hip-width apart. Keep a slight bend in your knees. Your ears, shoulders, and hips should line up. ?When you do a task in which you stand in one place for a long time, place one foot up on a stable object that is 2-4 inches (5-10 cm) high, such as a footstool. This helps keep your spine neutral. ?Sitting ? ?When sitting, keep your spine neutral and keep your feet flat on the floor. Use a footrest, if necessary, and keep your thighs parallel to the floor. Avoid rounding your shoulders, and avoid tilting your head forward. ?When working at a desk or a  computer, keep your desk at a height where your hands are slightly lower than your elbows. Slide your chair under your desk so you are close enough to maintain good posture. ?When working at a computer, place your monitor at a height where you are looking straight ahead and you do not have to tilt your head forward or downward to look at the screen. ?Resting ?When lying down and resting, avoid positions that are most painful for you. ?If you have pain with activities such as sitting, bending, stooping, or squatting, lie in a position in which your body does not bend very much. For example, avoid curling up on your side with your arms and knees near your chest (fetal position). ?If you have pain with activities such as standing for a long time or reaching with your arms, lie with your spine in a neutral position and bend your knees slightly. Try the following positions: ?Lying on your side with a pillow between your  knees. ?Lying on your back with a pillow under your knees. ?Lifting ? ?When lifting objects, keep your feet at least shoulder-width apart and tighten your abdominal muscles. ?Bend your knees and hips and keep your spine

## 2022-02-17 LAB — URINALYSIS, ROUTINE W REFLEX MICROSCOPIC
Bilirubin Urine: NEGATIVE
Glucose, UA: NEGATIVE
Hgb urine dipstick: NEGATIVE
Ketones, ur: NEGATIVE
Leukocytes,Ua: NEGATIVE
Nitrite: NEGATIVE
Protein, ur: NEGATIVE
Specific Gravity, Urine: 1.014 (ref 1.001–1.035)
pH: 7 (ref 5.0–8.0)

## 2022-02-17 LAB — URINE CULTURE
MICRO NUMBER:: 13357960
Result:: NO GROWTH
SPECIMEN QUALITY:: ADEQUATE

## 2022-02-19 ENCOUNTER — Telehealth: Payer: Self-pay

## 2022-02-19 ENCOUNTER — Telehealth: Payer: Self-pay | Admitting: Internal Medicine

## 2022-02-19 NOTE — Telephone Encounter (Signed)
Lft pt vm to call ofc . Thanks  

## 2022-02-19 NOTE — Telephone Encounter (Addendum)
Lvm for pt to return call in regards to lab results.  ? ?Per Dr.Tracy: ?No UTI  ?Ldl cholesterol worse  ?-rec healthy diet and exercise  ?Iron ferritin low  ?-rec multivitamin with iron daily otc  ?Vitamin D low sent D3 1x per week x 5months then D3 4000 to 5000 Iu daily over the counter  ?   ?Liver kidneys normal  ?Blood cts normal  ? ? ?Low back Xray normal  ?Does she want to do PT?  ?

## 2022-03-08 ENCOUNTER — Ambulatory Visit
Admission: RE | Admit: 2022-03-08 | Discharge: 2022-03-08 | Disposition: A | Discharge status: Home / Self Care | Payer: 59 | Source: Ambulatory Visit | Attending: Internal Medicine | Admitting: Internal Medicine

## 2022-03-08 DIAGNOSIS — N6009 Solitary cyst of unspecified breast: Secondary | ICD-10-CM | POA: Insufficient documentation

## 2022-03-08 DIAGNOSIS — N6489 Other specified disorders of breast: Secondary | ICD-10-CM | POA: Diagnosis not present

## 2022-03-08 DIAGNOSIS — N644 Mastodynia: Secondary | ICD-10-CM | POA: Insufficient documentation

## 2022-03-08 DIAGNOSIS — R922 Inconclusive mammogram: Secondary | ICD-10-CM | POA: Diagnosis not present

## 2022-03-09 ENCOUNTER — Telehealth: Payer: Self-pay

## 2022-03-09 NOTE — Telephone Encounter (Signed)
Lvm for pt to return call in regards to mammogram results.  Per Dr.Tracy: No cancer seen rec mammogram in 1 year

## 2022-03-16 ENCOUNTER — Encounter: Payer: Self-pay | Admitting: Internal Medicine

## 2022-04-10 ENCOUNTER — Encounter: Payer: Self-pay | Admitting: Internal Medicine

## 2022-04-11 ENCOUNTER — Other Ambulatory Visit: Payer: Self-pay | Admitting: Internal Medicine

## 2022-04-11 ENCOUNTER — Ambulatory Visit: Payer: 59 | Admitting: Internal Medicine

## 2022-04-11 DIAGNOSIS — N3000 Acute cystitis without hematuria: Secondary | ICD-10-CM

## 2022-05-01 ENCOUNTER — Other Ambulatory Visit: Payer: Self-pay

## 2022-05-01 DIAGNOSIS — Z01419 Encounter for gynecological examination (general) (routine) without abnormal findings: Secondary | ICD-10-CM | POA: Diagnosis not present

## 2022-05-01 MED ORDER — AMLODIPINE BESYLATE 2.5 MG PO TABS
ORAL_TABLET | ORAL | 3 refills | Status: DC
  Filled 2022-05-01: qty 90, 90d supply, fill #0

## 2022-05-02 ENCOUNTER — Other Ambulatory Visit: Payer: Self-pay

## 2022-05-02 MED ORDER — ESCITALOPRAM OXALATE 20 MG PO TABS
ORAL_TABLET | ORAL | 1 refills | Status: DC
  Filled 2022-05-02: qty 90, 90d supply, fill #0

## 2022-05-02 MED ORDER — AMLODIPINE BESYLATE 2.5 MG PO TABS
ORAL_TABLET | ORAL | 3 refills | Status: DC
  Filled 2022-05-02: qty 90, 90d supply, fill #0

## 2022-05-17 ENCOUNTER — Other Ambulatory Visit: Payer: Self-pay

## 2022-06-14 ENCOUNTER — Other Ambulatory Visit: Payer: Self-pay

## 2022-06-14 ENCOUNTER — Ambulatory Visit (INDEPENDENT_AMBULATORY_CARE_PROVIDER_SITE_OTHER): Payer: 59 | Admitting: Internal Medicine

## 2022-06-14 ENCOUNTER — Encounter: Payer: Self-pay | Admitting: Internal Medicine

## 2022-06-14 VITALS — BP 122/78 | HR 67 | Temp 98.5°F | Ht 65.0 in | Wt 231.8 lb

## 2022-06-14 DIAGNOSIS — Z23 Encounter for immunization: Secondary | ICD-10-CM

## 2022-06-14 DIAGNOSIS — J9801 Acute bronchospasm: Secondary | ICD-10-CM | POA: Diagnosis not present

## 2022-06-14 DIAGNOSIS — J40 Bronchitis, not specified as acute or chronic: Secondary | ICD-10-CM | POA: Diagnosis not present

## 2022-06-14 DIAGNOSIS — R454 Irritability and anger: Secondary | ICD-10-CM | POA: Diagnosis not present

## 2022-06-14 DIAGNOSIS — J309 Allergic rhinitis, unspecified: Secondary | ICD-10-CM | POA: Diagnosis not present

## 2022-06-14 DIAGNOSIS — I10 Essential (primary) hypertension: Secondary | ICD-10-CM

## 2022-06-14 DIAGNOSIS — F39 Unspecified mood [affective] disorder: Secondary | ICD-10-CM | POA: Insufficient documentation

## 2022-06-14 DIAGNOSIS — F4321 Adjustment disorder with depressed mood: Secondary | ICD-10-CM | POA: Insufficient documentation

## 2022-06-14 DIAGNOSIS — F4010 Social phobia, unspecified: Secondary | ICD-10-CM | POA: Insufficient documentation

## 2022-06-14 DIAGNOSIS — G4700 Insomnia, unspecified: Secondary | ICD-10-CM

## 2022-06-14 DIAGNOSIS — F419 Anxiety disorder, unspecified: Secondary | ICD-10-CM | POA: Diagnosis not present

## 2022-06-14 MED ORDER — TRAZODONE HCL 50 MG PO TABS
ORAL_TABLET | ORAL | 3 refills | Status: DC
  Filled 2022-06-14: qty 90, 90d supply, fill #0

## 2022-06-14 MED ORDER — ALBUTEROL SULFATE HFA 108 (90 BASE) MCG/ACT IN AERS
2.0000 | INHALATION_SPRAY | Freq: Four times a day (QID) | RESPIRATORY_TRACT | 11 refills | Status: DC | PRN
  Filled 2022-06-14: qty 6.7, 30d supply, fill #0
  Filled 2023-01-15: qty 18, 25d supply, fill #0

## 2022-06-14 MED ORDER — AMLODIPINE BESYLATE 2.5 MG PO TABS
ORAL_TABLET | ORAL | 3 refills | Status: DC
  Filled 2022-06-14: qty 180, fill #0

## 2022-06-14 MED ORDER — AMLODIPINE BESYLATE 2.5 MG PO TABS
2.5000 mg | ORAL_TABLET | Freq: Two times a day (BID) | ORAL | 3 refills | Status: DC
  Filled 2022-06-14 – 2022-08-23 (×2): qty 180, 90d supply, fill #0
  Filled 2023-01-15: qty 180, 90d supply, fill #1

## 2022-06-14 MED ORDER — LEVOCETIRIZINE DIHYDROCHLORIDE 5 MG PO TABS
5.0000 mg | ORAL_TABLET | Freq: Every evening | ORAL | 3 refills | Status: AC | PRN
  Filled 2022-06-14: qty 90, 90d supply, fill #0

## 2022-06-14 NOTE — Progress Notes (Signed)
Chief Complaint  Patient presents with   Follow-up    Pt is here for a F/U and would like to discuss getting an emotional support animal as she does not want to keep taking a bunch of pills.   F/u  1. Grief/anxiety/depression/insomnia/anger and irritability/lack of motivation/interest since brother died months ago and his bday and her dads bday in November with hers and she does not want to celebrate her day She wants letter for emotional support to live with 2 cats in her appt  Will refer to psychiatry and therapy    Review of Systems  Constitutional:  Negative for weight loss.  HENT:  Negative for hearing loss.   Eyes:  Negative for blurred vision.  Respiratory:  Negative for shortness of breath.   Cardiovascular:  Negative for chest pain.  Gastrointestinal:  Negative for abdominal pain and blood in stool.  Genitourinary:  Negative for dysuria.  Musculoskeletal:  Negative for falls and joint pain.  Skin:  Negative for rash.  Neurological:  Negative for headaches.  Psychiatric/Behavioral:  Positive for depression. Negative for suicidal ideas. The patient is nervous/anxious and has insomnia.    Past Medical History:  Diagnosis Date   Abnormal Pap smear of cervix    lsil 01/06/13 no HPV testing done s/p cervical bx 01/15/13 negative +HPV/chronic cervicitis; pap 04/07/13 lsil HPV + f/u 09/17/13 negative; pap 01/14/14 Lsil HPV +; 08/26/14 negative transformation zone absent; pap 01/19/15 negative absent zone; pap 02/08/16 negative; pap 02/19/17 negative; pap 02/[facility]/18 [facility]    GERD (gastroesophageal reflux disease)    Headache    HTN (hypertension)    Past Surgical History:  Procedure Laterality Date   KNEE SURGERY     left knee arthroscopy 46 y.o    TUBAL LIGATION     WISDOM TOOTH EXTRACTION     Family History  Problem Relation Age of Onset   Hypertension Mother    Cancer Father        ? type mets died in 31s   Multiple sclerosis Sister    Cancer Brother         pancreatitic age 36/age 74 12/2020 with mets on chemo as of 10/26/23 brother died   Breast cancer Neg Hx    Social History   Socioeconomic History   Marital status: Single    Spouse name: Not on file   Number of children: Not on file   Years of education: Not on file   Highest education level: Not on file  Occupational History   Not on file  Tobacco Use   Smoking status: Former   Smokeless tobacco: Never  Vaping Use   Vaping Use: Never used  Substance and Sexual Activity   Alcohol use: Yes    Comment: occ   Drug use: No   Sexual activity: Yes  Other Topics Concern   Not on file  Social History Narrative   Works [employer] pt access    3 kids daughter 70 y.o 2 sons 27 y.o and 74 y.o as of 05/2019    -daughter lives outside of the home    1 cat at home   DPR mother [name] [phone]   Social Determinants of Health   Financial Resource Strain: Not on file  Food Insecurity: Not on file  Transportation Needs: Not on file  Physical Activity: Not on file  Stress: Not on file  Social Connections: Not on file  Intimate Partner Violence: Not on file   Current  Meds  Medication Sig   Cholecalciferol 1.25 MG (50000 UT) capsule Take 1 capsule (50,000 Units total) by mouth once a week. X 6 months then take otc D3 5000 IU daily   potassium chloride SA (KLOR-CON M) 20 MEQ tablet Take 1 tablet (20 mEq total) by mouth 2 (two) times daily. X 3 days   tiZANidine (ZANAFLEX) 4 MG tablet Take 1 tablet (4 mg total) by mouth at bedtime as needed for muscle spasms.   [DISCONTINUED] albuterol (VENTOLIN HFA) 108 (90 Base) MCG/ACT inhaler Inhale 2 puffs into the lungs every 6 (six) hours as needed for wheezing or shortness of breath.   [DISCONTINUED] amLODipine (NORVASC) 2.5 MG tablet TAKE 1 TO 2 TABLETS BY MOUTH DAILY IF BLOOD PRESSURE NOT <130/<80   [DISCONTINUED] amLODipine (NORVASC) 2.5 MG tablet one po q day   [DISCONTINUED] amLODipine (NORVASC) 2.5 MG tablet one po q day   [DISCONTINUED]  levocetirizine (XYZAL) 5 MG tablet Take 1 tablet (5 mg total) by mouth at bedtime as needed for allergies.   [DISCONTINUED] traZODone (DESYREL) 50 MG tablet TAKE 0.5-1 TABLETS BY MOUTH AT BEDTIME AS NEEDED FOR SLEEP.   Allergies  Allergen Reactions   Latex Hives   Lexapro [Escitalopram]     Increased anger    No results found for this or any previous visit (from the past 2160 hour(s)). Objective  Body mass index is 38.57 kg/m. Wt Readings from Last 3 Encounters:  06/14/22 231 lb 12.8 oz (105.1 kg)  02/16/22 224 lb 9.6 oz (101.9 kg)  10/05/21 218 lb 12.8 oz (99.2 kg)   Temp Readings from Last 3 Encounters:  06/14/22 98.5 F (36.9 C) (Oral)  02/16/22 98.5 F (36.9 C) (Oral)  10/05/21 (!) 97.1 F (36.2 C) (Temporal)   BP Readings from Last 3 Encounters:  06/14/22 122/78  02/16/22 118/74  10/05/21 122/80   Pulse Readings from Last 3 Encounters:  06/14/22 67  02/16/22 78  10/05/21 91    Physical Exam Vitals and nursing note reviewed.  Constitutional:      Appearance: Normal appearance. She is well-developed and well-groomed.  HENT:     Head: Normocephalic and atraumatic.  Eyes:     Conjunctiva/sclera: Conjunctivae normal.     Pupils: Pupils are equal, round, and reactive to light.  Cardiovascular:     Rate and Rhythm: Normal rate and regular rhythm.     Heart sounds: Normal heart sounds. No murmur heard. Pulmonary:     Effort: Pulmonary effort is normal.     Breath sounds: Normal breath sounds.  Abdominal:     General: Abdomen is flat. Bowel sounds are normal.     Tenderness: There is no abdominal tenderness.  Musculoskeletal:        General: No tenderness.  Skin:    General: Skin is warm and dry.  Neurological:     General: No focal deficit present.     Mental Status: She is alert and oriented to person, place, and time. Mental status is at baseline.     Cranial Nerves: Cranial nerves 2-12 are intact.     Motor: Motor function is intact.     Coordination:  Coordination is intact.     Gait: Gait is intact.  Psychiatric:        Attention and Perception: Attention and perception normal.        Mood and Affect: Mood and affect normal.        Speech: Speech normal.  Behavior: Behavior normal. Behavior is cooperative.        Thought Content: Thought content normal.        Cognition and Memory: Cognition and memory normal.        Judgment: Judgment normal.     Assessment  Plan  Irritability and anger - Plan: Ambulatory referral to Psychiatry Anxiety Social anxiety disorder Grief  Adjustment disorder with depressed mood - Plan: Ambulatory referral to Psychiatry  Insomnia, unspecified type - Plan: traZODone (DESYREL) 50 MG tablet, Ambulatory referral to Psychiatry  Essential hypertension - Plan: amLODipine (NORVASC) 2.5 MG tablet  HM Flu shot given today Tdap utd covid pfizer had 2/2 consider booster    mammo sch 08/27/19 had repeat US 09/04/19 1.8 x 1.4 x 1.8 left cyst repeat in 1 year  -referred 10/17/20 b/l cysts   10/17/20 negative ordered  03/08/22 negative    [facility] h/o abnormal pap per pt had cervical procedure on cervix ? leep or cone bx -pap 02/27/18 normal referred  ROI pap 05/02/21 normal   Colonoscopy sch age 74 y.o consider [facility] referral    Immune hep B Neg hiv and hep C rec healthy diet and exercise    Provider: Dr. French Ana McLean-Scocuzza-Internal Medicine

## 2022-06-14 NOTE — Patient Instructions (Addendum)
Dr. Clent Ridges change to her as new PCP  Colonoscopy due age 46   [facility] as given the info before for both  [facility] [place]  [address]  [place] [address] [phone]    [facility]   [address]  [place] [address]  [phone]    Call for a therapist above or with [facility] [phone]  Call for appt therapy above + f/u with psychiatrist    Caryn Section 5.0 (1)  Psychiatrist [address] mi  [address] Medicare/Medicaid accepted Directions Caryn Section No reviews  Psychiatrist 1.4 mi  [address]  [phone] Medicare/Medicaid accepted

## 2022-07-13 ENCOUNTER — Other Ambulatory Visit: Payer: Self-pay

## 2022-08-23 ENCOUNTER — Other Ambulatory Visit: Payer: Self-pay

## 2022-10-03 ENCOUNTER — Ambulatory Visit (INDEPENDENT_AMBULATORY_CARE_PROVIDER_SITE_OTHER): Payer: 59 | Admitting: Family

## 2022-10-03 ENCOUNTER — Encounter: Payer: Self-pay | Admitting: Family

## 2022-10-03 ENCOUNTER — Other Ambulatory Visit: Payer: Self-pay

## 2022-10-03 VITALS — BP 120/82 | HR 82 | Temp 97.7°F | Ht 65.0 in | Wt 236.2 lb

## 2022-10-03 DIAGNOSIS — Z0000 Encounter for general adult medical examination without abnormal findings: Secondary | ICD-10-CM | POA: Diagnosis not present

## 2022-10-03 DIAGNOSIS — Z1211 Encounter for screening for malignant neoplasm of colon: Secondary | ICD-10-CM

## 2022-10-03 DIAGNOSIS — Z23 Encounter for immunization: Secondary | ICD-10-CM

## 2022-10-03 DIAGNOSIS — F4321 Adjustment disorder with depressed mood: Secondary | ICD-10-CM

## 2022-10-03 DIAGNOSIS — E538 Deficiency of other specified B group vitamins: Secondary | ICD-10-CM

## 2022-10-03 DIAGNOSIS — G4700 Insomnia, unspecified: Secondary | ICD-10-CM

## 2022-10-03 LAB — URINALYSIS, ROUTINE W REFLEX MICROSCOPIC
Bilirubin Urine: NEGATIVE
Hgb urine dipstick: NEGATIVE
Ketones, ur: NEGATIVE
Leukocytes,Ua: NEGATIVE
Nitrite: NEGATIVE
Specific Gravity, Urine: 1.025 (ref 1.000–1.030)
Total Protein, Urine: NEGATIVE
Urine Glucose: NEGATIVE
Urobilinogen, UA: 0.2 (ref 0.0–1.0)
pH: 6.5 (ref 5.0–8.0)

## 2022-10-03 LAB — CBC WITH DIFFERENTIAL/PLATELET
Basophils Absolute: 0 10*3/uL (ref 0.0–0.1)
Basophils Relative: 0.4 % (ref 0.0–3.0)
Eosinophils Absolute: 0.2 10*3/uL (ref 0.0–0.7)
Eosinophils Relative: 2.7 % (ref 0.0–5.0)
HCT: 37.7 % (ref 36.0–46.0)
Hemoglobin: 12.6 g/dL (ref 12.0–15.0)
Lymphocytes Relative: 23.4 % (ref 12.0–46.0)
Lymphs Abs: 1.9 10*3/uL (ref 0.7–4.0)
MCHC: 33.4 g/dL (ref 30.0–36.0)
MCV: 92.2 fl (ref 78.0–100.0)
Monocytes Absolute: 0.8 10*3/uL (ref 0.1–1.0)
Monocytes Relative: 9.7 % (ref 3.0–12.0)
Neutro Abs: 5.3 10*3/uL (ref 1.4–7.7)
Neutrophils Relative %: 63.8 % (ref 43.0–77.0)
Platelets: 296 10*3/uL (ref 150.0–400.0)
RBC: 4.09 Mil/uL (ref 3.87–5.11)
RDW: 13.3 % (ref 11.5–15.5)
WBC: 8.3 10*3/uL (ref 4.0–10.5)

## 2022-10-03 LAB — IBC + FERRITIN
Ferritin: 13.9 ng/mL (ref 10.0–291.0)
Iron: 145 ug/dL (ref 42–145)
Saturation Ratios: 34.8 % (ref 20.0–50.0)
TIBC: 417.2 ug/dL (ref 250.0–450.0)
Transferrin: 298 mg/dL (ref 212.0–360.0)

## 2022-10-03 LAB — B12 AND FOLATE PANEL
Folate: 7.6 ng/mL (ref >5.9)
Vitamin B-12: 170 pg/mL — ABNORMAL LOW (ref 211–911)

## 2022-10-03 LAB — VITAMIN D 25 HYDROXY (VIT D DEFICIENCY, FRACTURES): VITD: 14.34 ng/mL — ABNORMAL LOW (ref 30.00–100.00)

## 2022-10-03 LAB — HEMOGLOBIN A1C: Hgb A1c MFr Bld: 5.5 % (ref 4.6–6.5)

## 2022-10-03 LAB — TSH: TSH: 1.67 u[IU]/mL (ref 0.35–5.50)

## 2022-10-03 MED ORDER — TRAZODONE HCL 50 MG PO TABS
50.0000 mg | ORAL_TABLET | Freq: Every day | ORAL | 3 refills | Status: DC
  Filled 2022-10-03: qty 90, 90d supply, fill #0

## 2022-10-03 NOTE — Patient Instructions (Addendum)
Referral to colonoscopy  Let us know if you dont hear back within a week in regards to an appointment being scheduled.   So that you are aware, if you are [facility] MyChart user , please pay attention to your MyChart messages as you may receive a MyChart message with a phone number to call and schedule this test/appointment own your own from our referral coordinator. This is a new process so I do not want you to miss this message.  If you are not a MyChart user, you will receive a phone call.  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 We do not carry polio vaccine.   [email or web address]  Phone: [phone]   Health Maintenance, Female Adopting a healthy lifestyle and getting preventive care are important in promoting health and wellness. Ask your health care provider about: The right schedule for you to have regular tests and exams. Things you can do on your own to prevent diseases and keep yourself healthy. What should I know about diet, weight, and exercise? Eat a healthy diet  Eat a diet that includes plenty of vegetables, fruits, low-fat dairy products, and lean protein. Do not eat a lot of foods that are high in solid fats, added sugars, or sodium. Maintain a healthy weight Body mass index (BMI) is used to identify weight problems. It estimates body fat based on height and weight. Your health care provider can help determine your BMI and help you achieve or maintain a healthy weight. Get regular exercise Get regular exercise. This is one of the most important things you can do for your health. Most adults should: Exercise for at least 150 minutes each week. The exercise should increase your heart rate and make you sweat (moderate-intensity exercise). Do strengthening exercises at least twice a week. This is in addition to the moderate-intensity exercise. Spend less time sitting.  Even light physical activity can be beneficial. Watch cholesterol and blood lipids Have your blood tested for lipids and cholesterol at 45 years of age, then have this test every 5 years. Have your cholesterol levels checked more often if: Your lipid or cholesterol levels are high. You are older than 45 years of age. You are at high risk for heart disease. What should I know about cancer screening? Depending on your health history and family history, you may need to have cancer screening at various ages. This may include screening for: Breast cancer. Cervical cancer. Colorectal cancer. Skin cancer. Lung cancer. What should I know about heart disease, diabetes, and high blood pressure? Blood pressure and heart disease High blood pressure causes heart disease and increases the risk of stroke. This is more likely to develop in people who have high blood pressure readings or are overweight. Have your blood pressure checked: Every 3-5 years if you are 15-15 years of age. Every year if you are 85 years old or older. Diabetes Have regular diabetes screenings. This checks your fasting blood sugar level. Have the screening done: Once every three years after age 62 if you are at a normal weight and have a low risk for diabetes. More often and at a younger age if you are overweight or have a high risk for diabetes. What should I know about preventing infection? Hepatitis B If you have a higher risk for hepatitis B, you should be screened for this virus. Talk with your health care provider to find out if you are at risk for hepatitis B infection. Hepatitis  C Testing is recommended for: Everyone born from [birth year] through 1964. Anyone with known risk factors for hepatitis C. Sexually transmitted infections (STIs) Get screened for STIs, including gonorrhea and chlamydia, if: You are sexually active and are younger than 44 years of age. You are older than 45 years of age and your health care provider  tells you that you are at risk for this type of infection. Your sexual activity has changed since you were last screened, and you are at increased risk for chlamydia or gonorrhea. Ask your health care provider if you are at risk. Ask your health care provider about whether you are at high risk for HIV. Your health care provider may recommend a prescription medicine to help prevent HIV infection. If you choose to take medicine to prevent HIV, you should first get tested for HIV. You should then be tested every 3 months for as long as you are taking the medicine. Pregnancy If you are about to stop having your period (premenopausal) and you may become pregnant, seek counseling before you get pregnant. Take 400 to 800 micrograms (mcg) of folic acid every day if you become pregnant. Ask for birth control (contraception) if you want to prevent pregnancy. Osteoporosis and menopause Osteoporosis is a disease in which the bones lose minerals and strength with aging. This can result in bone fractures. If you are 51 years old or older, or if you are at risk for osteoporosis and fractures, ask your health care provider if you should: Be screened for bone loss. Take a calcium or vitamin D supplement to lower your risk of fractures. Be given hormone replacement therapy (HRT) to treat symptoms of menopause. Follow these instructions at home: Alcohol use Do not drink alcohol if: Your health care provider tells you not to drink. You are pregnant, may be pregnant, or are planning to become pregnant. If you drink alcohol: Limit how much you have to: 0-1 drink a day. Know how much alcohol is in your drink. In the [place] one drink equals one 12 oz bottle of beer (355 mL), one 5 oz glass of wine (148 mL), or one 1 oz glass of hard liquor (44 mL). Lifestyle Do not use any products that contain nicotine or tobacco. These products include cigarettes, chewing tobacco, and vaping devices, such as e-cigarettes. If you  need help quitting, ask your health care provider. Do not use street drugs. Do not share needles. Ask your health care provider for help if you need support or information about quitting drugs. General instructions Schedule regular health, dental, and eye exams. Stay current with your vaccines. Tell your health care provider if: You often feel depressed. You have ever been abused or do not feel safe at home. Summary Adopting a healthy lifestyle and getting preventive care are important in promoting health and wellness. Follow your health care provider's instructions about healthy diet, exercising, and getting tested or screened for diseases. Follow your health care provider's instructions on monitoring your cholesterol and blood pressure. This information is not intended to replace advice given to you by your health care provider. Make sure you discuss any questions you have with your health care provider. Document Revised: 02/20/2021 Document Reviewed: 02/20/2021 Elsevier Patient Education  2023 [employer].

## 2022-10-03 NOTE — Progress Notes (Signed)
Assessment & Plan:  Annual physical exam Assessment & Plan: Declines clinical breast exam.  She follows with GYN and she declines pelvic exam.  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  Colonoscopy order placed.  Orders: -     Measles/Mumps/Rubella Immunity -     Hepatitis A antibody, total -     Hepatitis B core antibody, total -     Hepatitis B surface antibody,qualitative -     Hepatitis B surface antigen -     QuantiFERON-TB Gold Plus -     Ambulatory referral to Gastroenterology -     TSH -     Hemoglobin A1c -     VITAMIN D 25 Hydroxy (Vit-D Deficiency, Fractures) -     Intrinsic Factor Antibodies -     Homocysteine -     Methylmalonic acid, serum -     Celiac Disease Ab Screen w/Rfx -     B12 and Folate Panel -     IBC + Ferritin -     CBC with Differential/Platelet -     Varicella Zoster Abs, IgG/IgM -     Urinalysis, Routine w reflex microscopic  Insomnia, unspecified type -     traZODone HCl; Take 1 tablet (50 mg total) by mouth at bedtime.  Dispense: 90 tablet; Refill: 3  Screen for colon cancer -     Ambulatory referral to Gastroenterology  Encounter for immunization -     Measles/Mumps/Rubella Immunity -     Hepatitis A antibody, total -     Hepatitis B core antibody, total -     Hepatitis B surface antibody,qualitative -     Hepatitis B surface antigen -     QuantiFERON-TB Gold Plus -     Varicella Zoster Abs, IgG/IgM -     Acute Hep Panel & Hep B Surface Ab  B12 deficiency -     Intrinsic Factor Antibodies -     Homocysteine -     Methylmalonic acid, serum -     Celiac Disease Ab Screen w/Rfx -     B12 and Folate Panel -     IBC + Ferritin  Grief Assessment & Plan: Grief after loss of brother has improved over time.  She feels that she is coping better now.  She declines starting another SSRI.  I did advise to start trazodone 50 mg nightly to aid with sleep.  She is willing to start medication and will let me  know how she is doing.   Other orders -     REFLEX TIQ     Return precautions given.   Risks, benefits, and alternatives of the medications and treatment plan prescribed today were discussed, and patient expressed understanding.   Education regarding symptom management and diagnosis given to patient on AVS either electronically or printed.  No follow-ups on file.  Rennie Plowman, FNP  Subjective:    Patient ID: Cassandra Cardenas, female    DOB: 10/10/1976, 46 y.o.   MRN: [id number]  CC: Cassandra Cardenas is a 46 y.o. female who presents today for physical exam.    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  No hearing loss.     She is not taking iron.  No h/o asplenia,  bleeding disorder, or known complement deficiency She is not living in a dorm or residential housing.  Referred to psychiatry in 05/2022 by previous PCP but she didn't feel that she needed. Her brother passed away and she feels she is coping better now.  She never started trazodone 50 mg. She struggles to fall asleep. Previously on lexapro and felt numb on medication. No anxiety.  No si/hi.   Hypertension-compliant with amlodipine 2.5mg    Colorectal Cancer Screening: due Breast Cancer Screening:Diag Mammogram UTD; previous right breast mass.  Right breast ultrasound without evidence of malignancy Cervical Cancer Screening: due; follows with GYN Bone Health screening/DEXA for 65+: No increased fracture risk. Defer screening at this time  Lung Cancer Screening: Doesn't have 20 year pack year history and age > 39 years yo 2 years        Tetanus - utd        Labs: Screening labs today. Exercise: No regular exercise.   Alcohol use: Occasional Smoking/tobacco use: former smoker.    Health Maintenance  Topic Date Due    COLONOSCOPY (Pts 45-87yrs Insurance coverage will need to be confirmed)  Never done   COVID-19 Vaccine (3 - Pfizer risk series) 10/15/2022 (Originally 03/18/2020)   PAP SMEAR-Modifier  05/02/2024   DTaP/Tdap/Td (2 - Td or Tdap) 10/06/2031   INFLUENZA VACCINE  Completed   Hepatitis C Screening  Completed   HIV Screening  Completed   HPV VACCINES  Aged Out    ALLERGIES: Latex and Lexapro [escitalopram]  Current Outpatient Medications on File Prior to Visit  Medication Sig Dispense Refill   albuterol (VENTOLIN HFA) 108 (90 Base) MCG/ACT inhaler Inhale 2 puffs into the lungs every 6 (six) hours as needed for wheezing or shortness of breath. 6.7 g 11   amLODipine (NORVASC) 2.5 MG tablet Take 1 tablet (2.5 mg total) by mouth 2 (two) times daily. If BP >130/>80 180 tablet 3   Cholecalciferol 1.25 MG (50000 UT) capsule Take 1 capsule (50,000 Units total) by mouth once a week. X 6 months then take otc D3 5000 IU daily 13 capsule 1   levocetirizine (XYZAL) 5 MG tablet Take 1 tablet (5 mg total) by mouth at bedtime as needed for allergies. 90 tablet 3   tiZANidine (ZANAFLEX) 4 MG tablet Take 1 tablet (4 mg total) by mouth at bedtime as needed for muscle spasms. 30 tablet 2   No current facility-administered medications on file prior to visit.    Review of Systems  Constitutional:  Negative for chills, fever and unexpected weight change.  HENT:  Negative for congestion.   Respiratory:  Negative for cough.   Cardiovascular:  Negative for chest pain, palpitations and leg swelling.  Gastrointestinal:  Negative for nausea and vomiting.  Musculoskeletal:  Negative for arthralgias and myalgias.  Skin:  Negative for rash.  Neurological:  Negative for headaches.  Hematological:  Negative for adenopathy.  Psychiatric/Behavioral:  Negative for confusion.       Objective:    BP 120/82   Pulse 82   Temp 97.7 F (36.5 C) (Oral)   Ht 5\' 5"  (1.651 m)   Wt 236 lb 3.2 oz (107.1 kg)   LMP  (LMP Unknown)    SpO2 98%   BMI 39.31 kg/m   BP Readings from Last 3 Encounters:  10/03/22 120/82  06/14/22 122/78  02/16/22 118/74   Wt Readings from Last 3 Encounters:  10/03/22 236 lb 3.2 oz (107.1 kg)  06/14/22 231 lb 12.8 oz (105.1 kg)  02/16/22 224 lb 9.6 oz (101.9 kg)    Physical Exam Vitals reviewed.  Constitutional:      Appearance: She is well-developed.  Eyes:     Conjunctiva/sclera: Conjunctivae normal.  Cardiovascular:     Rate and Rhythm: Normal rate and regular rhythm.     Pulses: Normal pulses.     Heart sounds: Normal heart sounds.  Pulmonary:     Effort: Pulmonary effort is normal.     Breath sounds: Normal breath sounds. No wheezing, rhonchi or rales.  Skin:    General: Skin is warm and dry.  Neurological:     Mental Status: She is alert.  Psychiatric:        Speech: Speech normal.        Behavior: Behavior normal.        Thought Content: Thought content normal.

## 2022-10-05 LAB — CELIAC DISEASE AB SCREEN W/RFX
Antigliadin Abs, IgA: 6 units (ref 0–19)
IgA/Immunoglobulin A, Serum: 244 mg/dL (ref 87–352)
Transglutaminase IgA: <2 U/mL (ref 0–3)

## 2022-10-05 LAB — VARICELLA ZOSTER ABS, IGG/IGM
Varicella IgM: <0.91 index (ref 0.00–0.90)
Varicella zoster IgG: 405 index (ref >165)

## 2022-10-06 LAB — HEPATITIS A ANTIBODY, TOTAL: Hepatitis A AB,Total: NONREACTIVE

## 2022-10-06 LAB — MEASLES/MUMPS/RUBELLA IMMUNITY
Mumps IgG: <9 AU/mL — ABNORMAL LOW
Rubella: 1.49 Index
Rubeola IgG: 99.9 AU/mL

## 2022-10-06 LAB — HEPATITIS B CORE ANTIBODY, TOTAL: Hep B Core Total Ab: NONREACTIVE

## 2022-10-06 LAB — HOMOCYSTEINE: Homocysteine: 12.9 umol/L — ABNORMAL HIGH (ref <10.4)

## 2022-10-06 LAB — QUANTIFERON-TB GOLD PLUS
Mitogen-NIL: >10 IU/mL
NIL: 0.04 IU/mL
QuantiFERON-TB Gold Plus: NEGATIVE
TB1-NIL: 0.06 IU/mL
TB2-NIL: 0.04 IU/mL

## 2022-10-06 LAB — INTRINSIC FACTOR ANTIBODIES: Intrinsic Factor: NEGATIVE

## 2022-10-06 LAB — HEPATITIS B SURFACE ANTIBODY,QUALITATIVE: Hep B S Ab: REACTIVE — AB

## 2022-10-06 LAB — ACUTE HEP PANEL AND HEP B SURFACE AB
HEPATITIS C ANTIBODY REFILL$(REFL): NONREACTIVE
Hep A IgM: NONREACTIVE
Hep B C IgM: NONREACTIVE
Hepatitis B Surface Ag: NONREACTIVE

## 2022-10-06 LAB — METHYLMALONIC ACID, SERUM: Methylmalonic Acid, Quant: 115 nmol/L (ref 87–318)

## 2022-10-06 LAB — REFLEX TIQ

## 2022-10-11 ENCOUNTER — Telehealth: Payer: Self-pay | Admitting: Family

## 2022-10-11 NOTE — Telephone Encounter (Signed)
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

## 2022-10-12 ENCOUNTER — Telehealth: Payer: Self-pay | Admitting: Family Medicine

## 2022-10-12 ENCOUNTER — Telehealth: Payer: Self-pay | Admitting: Family

## 2022-10-12 NOTE — Telephone Encounter (Signed)
Cassandra Cardenas,Cassandra Cardenas  Can you check status of hep b surface antigen from 10/03/22? No result   Jenate Call pt Does she have records of childhood vaccines?  I consulted with [place] health department Jerel Shepherd whom advised per [facility] that polio vaccine vaccine is not necessary if she resides in the [place].     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   [place] [facility] department recommendation will be for her to locate her childhood vaccine record as if she grew up In the [place], went to school here she would likely have been vaccinated against polio. Please ask her to check the school where she graduated from.  She can also checked the clinic where she received childhood vaccines.  If attempts to locate vaccine fail, she could then get the polio vaccine at the health department.  Please ask her if she has reached out to [facility] health department per our last discussion. Advise waiting on one more hep b surface antigen lab and have asked lab to look into this then we can return paperwork.

## 2022-10-12 NOTE — Telephone Encounter (Signed)
Spoke to patient and she stated that she does have childhood vaccines from Health Dept and she will bring in to office.

## 2022-10-12 NOTE — Telephone Encounter (Signed)
close

## 2022-10-12 NOTE — Telephone Encounter (Signed)
Picked up immunizations and gave to [facility] for Cassandra Cardenas since she has the other paperwork

## 2022-10-12 NOTE — Telephone Encounter (Signed)
It has resulted per [facility]. They are faxing it over. Will probably go to your efax folder

## 2022-10-12 NOTE — Assessment & Plan Note (Addendum)
Declines clinical breast exam.  She follows with GYN and she declines pelvic exam.  Forms given for [employer], pending labs, titers.  Colonoscopy order placed.

## 2022-10-12 NOTE — Assessment & Plan Note (Signed)
Grief after loss of brother has improved over time.  She feels that she is coping better now.  She declines starting another SSRI.  I did advise to start trazodone 50 mg nightly to aid with sleep.  She is willing to start medication and will let me know how she is doing.

## 2022-10-12 NOTE — Telephone Encounter (Signed)
LVM to call back.

## 2022-10-12 NOTE — Telephone Encounter (Signed)
Patient dropped off immunization records. Envelope is up front in Dr Claris Che color folder. Patient will pick up copies when she comes in to pick up other paper work.

## 2022-10-14 ENCOUNTER — Other Ambulatory Visit: Payer: Self-pay

## 2022-10-16 ENCOUNTER — Other Ambulatory Visit: Payer: Self-pay | Admitting: Family

## 2022-10-16 ENCOUNTER — Other Ambulatory Visit: Payer: Self-pay

## 2022-10-16 DIAGNOSIS — Z8639 Personal history of other endocrine, nutritional and metabolic disease: Secondary | ICD-10-CM

## 2022-10-16 DIAGNOSIS — E538 Deficiency of other specified B group vitamins: Secondary | ICD-10-CM

## 2022-10-16 MED ORDER — CHOLECALCIFEROL 1.25 MG (50000 UT) PO CAPS
50000.0000 [IU] | ORAL_CAPSULE | ORAL | 0 refills | Status: DC
  Filled 2022-10-16 – 2023-01-15 (×2): qty 8, 56d supply, fill #0

## 2022-10-16 NOTE — Telephone Encounter (Signed)
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

## 2022-10-16 NOTE — Telephone Encounter (Signed)
See result note School Paperwork given to [employer]

## 2022-10-18 ENCOUNTER — Ambulatory Visit (INDEPENDENT_AMBULATORY_CARE_PROVIDER_SITE_OTHER): Payer: 59

## 2022-10-18 DIAGNOSIS — Z23 Encounter for immunization: Secondary | ICD-10-CM | POA: Diagnosis not present

## 2022-10-18 DIAGNOSIS — E538 Deficiency of other specified B group vitamins: Secondary | ICD-10-CM

## 2022-10-18 MED ORDER — CYANOCOBALAMIN 1000 MCG/ML IJ SOLN
1000.0000 ug | Freq: Once | INTRAMUSCULAR | Status: AC
  Administered 2022-10-18: 1000 ug via INTRAMUSCULAR

## 2022-10-18 NOTE — Progress Notes (Signed)
Patient presented for B 12 injection to left deltoid, patient voiced no concerns nor showed any signs of distress during injection  Patient presented for MMR  injection to left arm, patient voiced no concerns nor showed signs of distress during injection

## 2022-10-19 ENCOUNTER — Telehealth: Payer: Self-pay

## 2022-10-19 NOTE — Telephone Encounter (Signed)
Returned patients call to schedule her colonoscopy.  LVM for pt to return my call.   Thanks, Zackarie Chason, CMA 

## 2022-10-23 ENCOUNTER — Other Ambulatory Visit: Payer: Self-pay

## 2022-10-25 ENCOUNTER — Other Ambulatory Visit: Payer: Self-pay

## 2022-10-25 ENCOUNTER — Other Ambulatory Visit: Payer: Self-pay | Admitting: *Deleted

## 2022-10-25 ENCOUNTER — Telehealth: Payer: Self-pay | Admitting: *Deleted

## 2022-10-25 DIAGNOSIS — Z1211 Encounter for screening for malignant neoplasm of colon: Secondary | ICD-10-CM

## 2022-10-25 MED ORDER — NA SULFATE-K SULFATE-MG SULF 17.5-3.13-1.6 GM/177ML PO SOLN
1.0000 | Freq: Once | ORAL | 0 refills | Status: AC
  Filled 2022-10-25 – 2023-01-09 (×2): qty 354, 1d supply, fill #0

## 2022-10-25 NOTE — Telephone Encounter (Signed)
Gastroenterology Pre-Procedure Review  Request Date: 12/12/2022 Requesting Physician: Dr. Vicente Males  PATIENT REVIEW QUESTIONS: The patient responded to the following health history questions as indicated:    1. Are you having any GI issues? no 2. Do you have a personal history of Polyps? no 3. Do you have a family history of Colon Cancer or Polyps? yes (mom had polyps) 4. Diabetes Mellitus? no 5. Joint replacements in the past 12 months?no 6. Major health problems in the past 3 months?no 7. Any artificial heart valves, MVP, or defibrillator?no    MEDICATIONS & ALLERGIES:    Patient reports the following regarding taking any anticoagulation/antiplatelet therapy:   Plavix, Coumadin, Eliquis, Xarelto, Lovenox, Pradaxa, Brilinta, or Effient? no Aspirin? no  Patient confirms/reports the following medications:  Current Outpatient Medications  Medication Sig Dispense Refill   Na Sulfate-K Sulfate-Mg Sulf 17.5-3.13-1.6 GM/177ML SOLN Take 1 kit by mouth once for 1 dose. 354 mL 0   albuterol (VENTOLIN HFA) 108 (90 Base) MCG/ACT inhaler Inhale 2 puffs into the lungs every 6 (six) hours as needed for wheezing or shortness of breath. 6.7 g 11   amLODipine (NORVASC) 2.5 MG tablet Take 1 tablet (2.5 mg total) by mouth 2 (two) times daily. If BP >130/>80 180 tablet 3   Cholecalciferol 1.25 MG (50000 UT) capsule Take 1 capsule by mouth once weekly for 8 weeks 8 capsule 0   levocetirizine (XYZAL) 5 MG tablet Take 1 tablet (5 mg total) by mouth at bedtime as needed for allergies. 90 tablet 3   tiZANidine (ZANAFLEX) 4 MG tablet Take 1 tablet (4 mg total) by mouth at bedtime as needed for muscle spasms. 30 tablet 2   traZODone (DESYREL) 50 MG tablet Take 1 tablet (50 mg total) by mouth at bedtime. 90 tablet 3   No current facility-administered medications for this visit.    Patient confirms/reports the following allergies:  Allergies  Allergen Reactions   Latex Hives   Lexapro [Escitalopram]      Increased anger     No orders of the defined types were placed in this encounter.   AUTHORIZATION INFORMATION Primary Insurance: 1D#: Group #:  Secondary Insurance: 1D#: Group #:  SCHEDULE INFORMATION: Date: 12/12/2022 Time: Location: [place]

## 2022-11-09 ENCOUNTER — Other Ambulatory Visit: Payer: Self-pay

## 2022-11-19 ENCOUNTER — Ambulatory Visit (INDEPENDENT_AMBULATORY_CARE_PROVIDER_SITE_OTHER): Payer: 59

## 2022-11-19 DIAGNOSIS — Z23 Encounter for immunization: Secondary | ICD-10-CM

## 2022-11-19 NOTE — Progress Notes (Signed)
Pt presented for her 2nd dose of the SUBQ MMR II vaccine. Pt was identified through two identifiers. Pt tolerated vaccine well in the right arm. Pt was given the MMR VIS and it was explained.

## 2022-12-12 ENCOUNTER — Telehealth: Payer: Self-pay | Admitting: Gastroenterology

## 2022-12-12 ENCOUNTER — Encounter: Admission: RE | Payer: Self-pay | Source: Ambulatory Visit

## 2022-12-12 ENCOUNTER — Ambulatory Visit: Admission: RE | Admit: 2022-12-12 | Payer: 59 | Source: Ambulatory Visit | Admitting: Gastroenterology

## 2022-12-12 ENCOUNTER — Other Ambulatory Visit: Payer: Self-pay | Admitting: *Deleted

## 2022-12-12 DIAGNOSIS — Z1211 Encounter for screening for malignant neoplasm of colon: Secondary | ICD-10-CM

## 2022-12-12 SURGERY — COLONOSCOPY WITH PROPOFOL
Anesthesia: General

## 2022-12-12 NOTE — Telephone Encounter (Addendum)
Patient is unable to do colonoscopy on 12/12/2022. Endo Unit [facility]) already cancel patient's colonoscopy on 12/11/2022.  I have called patient back and we have reschedule the colonoscopy to 01/10/2023.  New instructions will be sent for patient. Patient verbalized understanding.

## 2022-12-12 NOTE — Telephone Encounter (Signed)
Patient calling to reschedule colonoscopy.

## 2023-01-09 ENCOUNTER — Other Ambulatory Visit: Payer: Self-pay

## 2023-01-09 NOTE — Anesthesia Preprocedure Evaluation (Signed)
Anesthesia Evaluation  Patient identified by MRN, date of birth, ID band Patient awake    Reviewed: Allergy & Precautions, H&P , NPO status , Patient's Chart, lab work & pertinent test results  Airway Mallampati: III  TM Distance: >3 FB Neck ROM: full    Dental   Braces:   Pulmonary former smoker   Pulmonary exam normal        Cardiovascular hypertension, Pt. on medications Normal cardiovascular exam     Neuro/Psych negative neurological ROS  negative psych ROS   GI/Hepatic Neg liver ROS,GERD  Controlled,,  Endo/Other  negative endocrine ROS    Renal/GU negative Renal ROS  negative genitourinary   Musculoskeletal   Abdominal  (+) + obese  Peds  Hematology negative hematology ROS (+)   Anesthesia Other Findings Past Medical History: No date: Abnormal Pap smear of cervix     Comment:  lsil 01/06/13 no HPV testing done s/p cervical bx 01/15/13               negative +HPV/chronic cervicitis; pap 04/07/13 lsil HPV +               f/u 09/17/13 negative; pap 01/14/14 Lsil HPV +; 08/26/14               negative transformation zone absent; pap 01/19/15 negative               absent zone; pap 02/08/16 negative; pap 02/19/17 negative;               pap 02/27/18 negative [facility]  No date: GERD (gastroesophageal reflux disease) No date: Headache No date: HTN (hypertension)  Past Surgical History: No date: KNEE SURGERY     Comment:  left knee arthroscopy 47 y.o  No date: TUBAL LIGATION No date: WISDOM TOOTH EXTRACTION     Reproductive/Obstetrics negative OB ROS                             Anesthesia Physical Anesthesia Plan  ASA: 2  Anesthesia Plan: General   Post-op Pain Management: Minimal or no pain anticipated   Induction: Intravenous  PONV Risk Score and Plan: Propofol infusion and TIVA  Airway Management Planned: Natural Airway  Additional Equipment:   Intra-op Plan:    Post-operative Plan:   Informed Consent: I have reviewed the patients History and Physical, chart, labs and discussed the procedure including the risks, benefits and alternatives for the proposed anesthesia with the patient or authorized representative who has indicated his/her understanding and acceptance.     Dental Advisory Given  Plan Discussed with: CRNA and Surgeon  Anesthesia Plan Comments:         Anesthesia Quick Evaluation

## 2023-01-10 ENCOUNTER — Ambulatory Visit: Payer: 59 | Admitting: Anesthesiology

## 2023-01-10 ENCOUNTER — Encounter
Admission: RE | Disposition: A | Discharge status: Home / Self Care | Payer: Self-pay | Source: Ambulatory Visit | Attending: Gastroenterology

## 2023-01-10 ENCOUNTER — Encounter: Payer: Self-pay | Admitting: Gastroenterology

## 2023-01-10 ENCOUNTER — Ambulatory Visit
Admission: RE | Admit: 2023-01-10 | Discharge: 2023-01-10 | Disposition: A | Discharge status: Home / Self Care | Payer: 59 | Source: Ambulatory Visit | Attending: Gastroenterology | Admitting: Gastroenterology

## 2023-01-10 DIAGNOSIS — Z09 Encounter for follow-up examination after completed treatment for conditions other than malignant neoplasm: Secondary | ICD-10-CM | POA: Insufficient documentation

## 2023-01-10 DIAGNOSIS — Z87891 Personal history of nicotine dependence: Secondary | ICD-10-CM | POA: Insufficient documentation

## 2023-01-10 DIAGNOSIS — K219 Gastro-esophageal reflux disease without esophagitis: Secondary | ICD-10-CM | POA: Insufficient documentation

## 2023-01-10 DIAGNOSIS — Z1211 Encounter for screening for malignant neoplasm of colon: Secondary | ICD-10-CM | POA: Diagnosis not present

## 2023-01-10 DIAGNOSIS — I10 Essential (primary) hypertension: Secondary | ICD-10-CM | POA: Diagnosis not present

## 2023-01-10 HISTORY — PX: COLONOSCOPY WITH PROPOFOL: SHX5780

## 2023-01-10 HISTORY — DX: Unspecified asthma, uncomplicated: J45.909

## 2023-01-10 LAB — POCT PREGNANCY, URINE: Preg Test, Ur: NEGATIVE

## 2023-01-10 SURGERY — COLONOSCOPY WITH PROPOFOL
Anesthesia: General

## 2023-01-10 MED ORDER — LIDOCAINE HCL (CARDIAC) PF 100 MG/5ML IV SOSY
PREFILLED_SYRINGE | INTRAVENOUS | Status: DC | PRN
  Administered 2023-01-10: 20 mg via INTRAVENOUS

## 2023-01-10 MED ORDER — PROPOFOL 10 MG/ML IV BOLUS
INTRAVENOUS | Status: DC | PRN
  Administered 2023-01-10: 120 ug/kg/min via INTRAVENOUS
  Administered 2023-01-10: 120 mg via INTRAVENOUS

## 2023-01-10 MED ORDER — PROPOFOL 10 MG/ML IV BOLUS
INTRAVENOUS | Status: AC
  Filled 2023-01-10: qty 40

## 2023-01-10 MED ORDER — SODIUM CHLORIDE 0.9 % IV SOLN: INTRAVENOUS | Status: DC

## 2023-01-10 NOTE — Transfer of Care (Signed)
Immediate Anesthesia Transfer of Care Note  Patient: Cassandra Cardenas  Procedure(s) Performed: COLONOSCOPY WITH PROPOFOL  Patient Location: Endoscopy Unit  Anesthesia Type:General  Level of Consciousness: drowsy  Airway & Oxygen Therapy: Patient Spontanous Breathing  Post-op Assessment: Report given to RN and Post -op Vital signs reviewed and stable  Post vital signs: Reviewed and stable  Last Vitals:  Vitals Value Taken Time  BP 105/59 01/10/23 0857  Temp 35.7 C 01/10/23 0857  Pulse 85 01/10/23 0857  Resp 16 01/10/23 0857  SpO2 100 % 01/10/23 0857    Last Pain:  Vitals:   01/10/23 0857  TempSrc: Temporal  PainSc: Asleep         Complications: No notable events documented.

## 2023-01-10 NOTE — H&P (Signed)
Jonathon Bellows, MD [address] [place], [place], [place], [address] [address], [place], [place], [place], [address] Phone: [phone]  Fax: [phone]  Primary Care Physician:  Carollee Leitz, MD   Pre-Procedure History & Physical: HPI:  Cassandra Cardenas is a 47 y.o. female is here for an colonoscopy.   Past Medical History:  Diagnosis Date   Abnormal Pap smear of cervix    lsil 01/06/13 no HPV testing done s/p cervical bx 01/15/13 negative +HPV/chronic cervicitis; pap 04/07/13 lsil HPV + f/u 09/17/13 negative; pap 01/14/14 Lsil HPV +; 08/26/14 negative transformation zone absent; pap 01/19/15 negative absent zone; pap 02/08/16 negative; pap 02/19/17 negative; pap 02/27/18 negative [facility]    Asthma    GERD (gastroesophageal reflux disease)    Headache    HTN (hypertension)     Past Surgical History:  Procedure Laterality Date   KNEE SURGERY     left knee arthroscopy 47 y.o    TUBAL LIGATION     WISDOM TOOTH EXTRACTION      Prior to Admission medications   Medication Sig Start Date End Date Taking? Authorizing Provider  albuterol (VENTOLIN HFA) 108 (90 Base) MCG/ACT inhaler Inhale 2 puffs into the lungs every 6 (six) hours as needed for wheezing or shortness of breath. 06/14/22  Yes McLean-Scocuzza, Nino Glow, MD  amLODipine (NORVASC) 2.5 MG tablet Take 1 tablet (2.5 mg total) by mouth 2 (two) times daily. If BP >130/>80 06/14/22 06/14/23 Yes McLean-Scocuzza, Nino Glow, MD  Cholecalciferol 1.25 MG (50000 UT) capsule Take 1 capsule by mouth once weekly for 8 weeks 10/16/22   Burnard Hawthorne, FNP  levocetirizine (XYZAL) 5 MG tablet Take 1 tablet (5 mg total) by mouth at bedtime as needed for allergies. 06/14/22   McLean-Scocuzza, Nino Glow, MD  Na Sulfate-K Sulfate-Mg Sulf 17.5-3.13-1.6 GM/177ML SOLN Take 1 kit by mouth once for 1 dose. 10/25/22 01/10/23  Jonathon Bellows, MD  tiZANidine (ZANAFLEX) 4 MG tablet Take 1 tablet (4 mg total) by mouth at bedtime as needed for muscle spasms.  02/16/22   McLean-Scocuzza, Nino Glow, MD  traZODone (DESYREL) 50 MG tablet Take 1 tablet (50 mg total) by mouth at bedtime. 10/03/22   Burnard Hawthorne, FNP    Allergies as of 12/12/2022 - Review Complete 10/03/2022  Allergen Reaction Noted   Latex Hives 03/14/2015   Lexapro [escitalopram]  06/14/2022    Family History  Problem Relation Age of Onset   Hypertension Mother    Cancer Father        ? type mets died in 78s   Multiple sclerosis Sister    Cancer Brother        pancreatitic age 7/age 47 12/2020 with mets on chemo as of 14-Oct-2023 brother died   Breast cancer Neg Hx     Social History   Socioeconomic History   Marital status: Single    Spouse name: Not on file   Number of children: Not on file   Years of education: Not on file   Highest education level: Not on file  Occupational History   Not on file  Tobacco Use   Smoking status: Former   Smokeless tobacco: Never  Vaping Use   Vaping Use: Never used  Substance and Sexual Activity   Alcohol use: Yes    Comment: occ   Drug use: No   Sexual activity: Yes  Other Topics Concern   Not on file  Social History Narrative   Works  [employer] pt access    3 kids daughter 70 y.o 2 sons 54 y.o and 35 y.o as of 05/2019    -daughter lives outside of the home    1 cat at home   DPR mother Luria Sondergaard [place] Strain: Not on file  Food Insecurity: Not on file  Transportation Needs: Not on file  Physical Activity: Not on file  Stress: Not on file  Social Connections: Not on file  Intimate Partner Violence: Not on file    Review of Systems: See HPI, otherwise negative ROS  Physical Exam: There were no vitals taken for this visit. General:   Alert,  pleasant and cooperative in NAD Head:  Normocephalic and atraumatic. Neck:  Supple; no masses or thyromegaly. Lungs:  Clear throughout to auscultation, normal respiratory effort.    Heart:  +S1, +S2, Regular rate and  rhythm, No edema. Abdomen:  Soft, nontender and nondistended. Normal bowel sounds, without guarding, and without rebound.   Neurologic:  Alert and  oriented x4;  grossly normal neurologically.  Impression/Plan: Cassandra Cardenas is here for an colonoscopy to be performed for Screening colonoscopy average risk   Risks, benefits, limitations, and alternatives regarding  colonoscopy have been reviewed with the patient.  Questions have been answered.  All parties agreeable.   Jonathon Bellows, MD  01/10/2023, 7:49 AM

## 2023-01-10 NOTE — Op Note (Signed)
[facility] Patient Name: Cassandra Cardenas Procedure Date: 01/10/2023 8:11 AM MRN: [id number] Account #: [id number] Date of Birth: March 19, 1976 Admit Type: Outpatient Age: 47 Room: [facility] ENDO ROOM 3 Gender: Female Note Status: Finalized Instrument Name: Jasper Riling [id number] Procedure:             Colonoscopy Indications:           Screening for colorectal malignant neoplasm Providers:             Jonathon Bellows MD, MD Referring MD:          Carollee Leitz (Referring MD) Medicines:             Monitored Anesthesia Care Complications:         No immediate complications. Procedure:             Pre-Anesthesia Assessment:                        - Prior to the procedure, a History and Physical was                         performed, and patient medications, allergies and                         sensitivities were reviewed. The patient's tolerance                         of previous anesthesia was reviewed.                        - The risks and benefits of the procedure and the                         sedation options and risks were discussed with the                         patient. All questions were answered and informed                         consent was obtained.                        - ASA Grade Assessment: II - A patient with mild                         systemic disease.                        After obtaining informed consent, the colonoscope was                         passed under direct vision. Throughout the procedure,                         the patient's blood pressure, pulse, and oxygen                         saturations were monitored continuously. The                         Colonoscope was introduced through  the anus and                         advanced to the the cecum, identified by the                         appendiceal orifice. The colonoscopy was performed                         with ease. The patient tolerated the procedure well.                          The quality of the bowel preparation was excellent.                         The ileocecal valve, appendiceal orifice, and rectum                         were photographed. Findings:      The perianal and digital rectal examinations were normal.      The entire examined colon appeared normal on direct and retroflexion       views. Impression:            - The entire examined colon is normal on direct and                         retroflexion views.                        - No specimens collected. Recommendation:        - Discharge patient to home (with escort).                        - Resume previous diet.                        - Continue present medications.                        - Repeat colonoscopy in 10 years for screening                         purposes. Procedure Code(s):     --- Professional ---                        [phone], Colonoscopy, flexible; diagnostic, including                         collection of specimen(s) by brushing or washing, when                         performed (separate procedure) Diagnosis Code(s):     --- Professional ---                        Z12.11, Encounter for screening for malignant neoplasm                         of colon CPT copyright 2022 American Medical Association. All rights reserved. The codes documented in this report are  preliminary and upon coder review may  be revised to meet current compliance requirements. Jonathon Bellows, MD Jonathon Bellows MD, MD 01/10/2023 8:56:03 AM This report has been signed electronically. Number of Addenda: 0 Note Initiated On: 01/10/2023 8:11 AM Scope Withdrawal Time: 0 hours 7 minutes 5 seconds  Total Procedure Duration: 0 hours 10 minutes 4 seconds  Estimated Blood Loss:  Estimated blood loss: none.      [facility]

## 2023-01-10 NOTE — Anesthesia Postprocedure Evaluation (Signed)
Anesthesia Post Note  Patient: Cassandra Cardenas  Procedure(s) Performed: COLONOSCOPY WITH PROPOFOL  Patient location during evaluation: Endoscopy Anesthesia Type: General Level of consciousness: awake and alert Pain management: pain level controlled Vital Signs Assessment: post-procedure vital signs reviewed and stable Respiratory status: spontaneous breathing, nonlabored ventilation and respiratory function stable Cardiovascular status: blood pressure returned to baseline and stable Postop Assessment: no apparent nausea or vomiting Anesthetic complications: no   No notable events documented.   Last Vitals:  Vitals:   01/10/23 0907 01/10/23 0917  BP: 110/76 132/72  Pulse: 78 68  Resp: 18 16  Temp:    SpO2: 100% 100%    Last Pain:  Vitals:   01/10/23 0917  TempSrc:   PainSc: 0-No pain                 [place] Ouch

## 2023-01-14 ENCOUNTER — Encounter: Payer: Self-pay | Admitting: Gastroenterology

## 2023-01-15 ENCOUNTER — Other Ambulatory Visit: Payer: Self-pay

## 2023-01-15 ENCOUNTER — Emergency Department: Payer: 59

## 2023-01-15 ENCOUNTER — Emergency Department
Admission: EM | Admit: 2023-01-15 | Discharge: 2023-01-15 | Disposition: A | Discharge status: Home / Self Care | Payer: 59 | Attending: Emergency Medicine | Admitting: Emergency Medicine

## 2023-01-15 DIAGNOSIS — J45909 Unspecified asthma, uncomplicated: Secondary | ICD-10-CM | POA: Insufficient documentation

## 2023-01-15 DIAGNOSIS — R079 Chest pain, unspecified: Secondary | ICD-10-CM | POA: Diagnosis not present

## 2023-01-15 DIAGNOSIS — I10 Essential (primary) hypertension: Secondary | ICD-10-CM | POA: Diagnosis not present

## 2023-01-15 DIAGNOSIS — R519 Headache, unspecified: Secondary | ICD-10-CM | POA: Diagnosis present

## 2023-01-15 LAB — CBC
HCT: 39.1 % (ref 36.0–46.0)
Hemoglobin: 12.5 g/dL (ref 12.0–15.0)
MCH: 29.6 pg (ref 26.0–34.0)
MCHC: 32 g/dL (ref 30.0–36.0)
MCV: 92.4 fL (ref 80.0–100.0)
Platelets: 299 10*3/uL (ref 150–400)
RBC: 4.23 MIL/uL (ref 3.87–5.11)
RDW: 12.8 % (ref 11.5–15.5)
WBC: 7.5 10*3/uL (ref 4.0–10.5)
nRBC: 0 % (ref 0.0–0.2)

## 2023-01-15 LAB — BASIC METABOLIC PANEL
Anion gap: 7 (ref 5–15)
BUN: 13 mg/dL (ref 6–20)
CO2: 23 mmol/L (ref 22–32)
Calcium: 8.7 mg/dL — ABNORMAL LOW (ref 8.9–10.3)
Chloride: 107 mmol/L (ref 98–111)
Creatinine, Ser: 1.05 mg/dL — ABNORMAL HIGH (ref 0.44–1.00)
GFR, Estimated: >60 mL/min (ref >60)
Glucose, Bld: 102 mg/dL — ABNORMAL HIGH (ref 70–99)
Potassium: 3.6 mmol/L (ref 3.5–5.1)
Sodium: 137 mmol/L (ref 135–145)

## 2023-01-15 LAB — TROPONIN I (HIGH SENSITIVITY): Troponin I (High Sensitivity): 4 ng/L (ref <18)

## 2023-01-15 NOTE — ED Provider Notes (Signed)
   [facility] Provider Note    Event Date/Time   First MD Initiated Contact with Patient 01/15/23 2121     (approximate)  History   Chief Complaint: Hypertension  HPI  Cassandra Cardenas is a 47 y.o. female with a past medical history of asthma, gastric reflux, hypertension, presents emergency department for high blood pressure and headache.  According to the patient she states she was not feeling very well yesterday had a headache, states she went to school today and they took her blood pressure and it was elevated 160 so the patient came to the emergency department.  Patient states mild headache although states it is resolving.  Denies any chest pain or abdominal pain.  Denies any fever.  Physical Exam   Triage Vital Signs: ED Triage Vitals  Enc Vitals Group     BP 01/15/23 2011 (!) 163/86     Pulse Rate 01/15/23 2011 93     Resp 01/15/23 2011 18     Temp 01/15/23 2011 98.5 F (36.9 C)     Temp Source 01/15/23 2011 Oral     SpO2 01/15/23 2011 97 %     Weight 01/15/23 2010 224 lb (101.6 kg)     Height 01/15/23 2010 5\' 5"  (1.651 m)     Head Circumference --      Peak Flow --      Pain Score 01/15/23 2009 9     Pain Loc --      Pain Edu? --      Excl. in [place]? --     Most recent vital signs: Vitals:   01/15/23 2011  BP: (!) 163/86  Pulse: 93  Resp: 18  Temp: 98.5 F (36.9 C)  SpO2: 97%    General: Awake, no distress.  CV:  Good peripheral perfusion.  Regular rate and rhythm  Resp:  Normal effort.  Equal breath sounds bilaterally.  Abd:  No distention.   ED Results / Procedures / Treatments   EKG  EKG viewed and interpreted by myself shows normal sinus rhythm at 74 bpm with a narrow QRS, normal axis, normal intervals, no concerning ST changes.  RADIOLOGY  I have reviewed and interpreted the chest x-ray images.  No consolidation seen on my evaluation. Radiology is read the chest x-ray is negative.   MEDICATIONS ORDERED IN  ED: Medications - No data to display   IMPRESSION / MDM / [place] / ED COURSE  I reviewed the triage vital signs and the nursing notes.  Patient's presentation is most consistent with acute presentation with potential threat to life or bodily function.  Patient presents emergency department for elevated blood pressure to 160/110 as well as a headache.  Overall the patient appears well, states her headache has dissipated and is mild currently.  Patient's lab work is reassuring with a normal CBC, normal chemistry, negative troponin.  Patient's chest x-ray is clear and EKG reassuring.  Patient's blood pressure is currently 130/81 during my evaluation.  Patient states she has been taking her amlodipine has not missed any doses.  Patient states she has not been drinking much fluids I discussed with the patient increasing her fluid intake as well as using Tylenol or ibuprofen.  FINAL CLINICAL IMPRESSION(S) / ED DIAGNOSES   Headache Hypertension   Note:  This document was prepared using Dragon voice recognition software and may include unintentional dictation errors.   Harvest Dark, MD 01/15/23 2154

## 2023-01-15 NOTE — ED Notes (Signed)
Dr. Corky Downs at bedside speaking with pt in triage

## 2023-01-15 NOTE — ED Provider Triage Note (Signed)
Emergency Medicine Provider Triage Evaluation Note  Cassandra Cardenas , a 47 y.o. female  was evaluated in triage.  Pt complains of high blood pressure and headache  Review of Systems  Positive: Headache Negative: Neurodeficits  Physical Exam  BP (!) 163/86 (BP Location: Left Arm)   Pulse 93   Temp 98.5 F (36.9 C) (Oral)   Resp 18   Ht 1.651 m (5\' 5" )   Wt 101.6 kg   SpO2 97%   BMI 37.28 kg/m  Gen:   Awake, no distress   Resp:  Normal effort  MSK:   Moves extremities without difficulty  Other:  Normal screening neuroexam  Medical Decision Making  Medically screening exam initiated at 8:18 PM.  Appropriate orders placed.  Cassandra Cardenas was informed that the remainder of the evaluation will be completed by another provider, this initial triage assessment does not replace that evaluation, and the importance of remaining in the ED until their evaluation is complete.     Lavonia Drafts, MD 01/15/23 2019

## 2023-01-15 NOTE — ED Triage Notes (Signed)
Reports HTN of 160/100 this AM and sent home from school. Reports has remained HTN since and is c/o h/a and back pain as well. Reports hx of HTN. Reports compliance with amlodipine at home. Pt ambulatory to triage. Alert and oriented following commands. Reports mild central cp without radiation. Denies SOB.

## 2023-01-16 ENCOUNTER — Ambulatory Visit: Payer: 59 | Admitting: Family

## 2023-01-16 ENCOUNTER — Other Ambulatory Visit: Payer: Self-pay

## 2023-01-17 ENCOUNTER — Other Ambulatory Visit: Payer: Self-pay

## 2023-01-17 ENCOUNTER — Ambulatory Visit (INDEPENDENT_AMBULATORY_CARE_PROVIDER_SITE_OTHER): Payer: 59 | Admitting: Nurse Practitioner

## 2023-01-17 VITALS — BP 126/84 | HR 69 | Temp 98.1°F | Ht 65.0 in | Wt 238.4 lb

## 2023-01-17 DIAGNOSIS — I10 Essential (primary) hypertension: Secondary | ICD-10-CM | POA: Diagnosis not present

## 2023-01-17 DIAGNOSIS — M79604 Pain in right leg: Secondary | ICD-10-CM

## 2023-01-17 MED ORDER — MELOXICAM 7.5 MG PO TABS
7.5000 mg | ORAL_TABLET | Freq: Every day | ORAL | 0 refills | Status: DC
  Filled 2023-01-17: qty 30, 30d supply, fill #0

## 2023-01-17 NOTE — Progress Notes (Signed)
Established Patient Office Visit  Subjective:  Patient ID: Cassandra Cardenas, female    DOB: 1976-04-30  Age: 47 y.o. MRN: [id number]  CC:  Chief Complaint  Patient presents with   Hypertension    Elevated BP 160/100 Headache Pain in legs & back since 01/14/23  Pain 5 out of 10    HPI  [name] presents for elevated blood pressure and headache.  She works ar [employer] and going for [employer].   She has been to the ED 2 days ago due to elevated blood pressure and headache.  Her labs, EKG and chest x-ray were reassuring.  She also complaint of back pain and upper leg pain going on from two months but has been worsened recently.   She is taking tylenol for the pain.   She complaint of pain in the thigh mostly on the right side with walking.   She denies any headache at present.    Hypertension Pertinent negatives include no headaches or shortness of breath.     Past Medical History:  Diagnosis Date   Abnormal Pap smear of cervix    lsil 01/06/13 no HPV testing done s/p cervical bx 01/15/13 negative +HPV/chronic cervicitis; pap 04/07/13 lsil HPV + f/u 09/17/13 negative; pap 01/14/14 Lsil HPV +; 08/26/14 negative transformation zone absent; pap 01/19/15 negative absent zone; pap 02/08/16 negative; pap 02/19/17 negative; pap 02/27/18 negative [facility]    Asthma    GERD (gastroesophageal reflux disease)    Headache    HTN (hypertension)     Past Surgical History:  Procedure Laterality Date   COLONOSCOPY WITH PROPOFOL N/A 01/10/2023   Procedure: COLONOSCOPY WITH PROPOFOL;  Surgeon: Wyline Mood, MD;  Location: [facility] ENDOSCOPY;  Service: Gastroenterology;  Laterality: N/A;   KNEE SURGERY     left knee arthroscopy 47 y.o    TUBAL LIGATION     WISDOM TOOTH EXTRACTION      Family History  Problem Relation Age of Onset   Hypertension Mother    Cancer Father        ? type mets died in 19s   Multiple sclerosis Sister    Cancer Brother        pancreatitic age 25/age 58  12/2020 with mets on chemo as of 2023-10-10 brother died   Breast cancer Neg Hx     Social History   Socioeconomic History   Marital status: Single    Spouse name: Not on file   Number of children: Not on file   Years of education: Not on file   Highest education level: Some college, no degree  Occupational History   Not on file  Tobacco Use   Smoking status: Former   Smokeless tobacco: Never  Vaping Use   Vaping Use: Never used  Substance and Sexual Activity   Alcohol use: Yes    Comment: occ   Drug use: No   Sexual activity: Yes  Other Topics Concern   Not on file  Social History Narrative   Works [employer] pt access    3 kids daughter 63 y.o 2 sons 65 y.o and 23 y.o as of 05/2019    -daughter lives outside of the home    1 cat at home   DPR mother [name] [phone]   Social Determinants of Health   Financial Resource Strain: Medium Risk (01/17/2023)   Overall Financial Resource Strain (CARDIA)    Difficulty of Paying Living Expenses: Somewhat hard  Food Insecurity: Food  Insecurity Present (01/17/2023)   Hunger Vital Sign    Worried About Running Out of Food in the Last Year: Sometimes true    Ran Out of Food in the Last Year: Never true  Transportation Needs: No Transportation Needs (01/17/2023)   PRAPARE - Administrator, Civil Service (Medical): No    Lack of Transportation (Non-Medical): No  Physical Activity: Unknown (01/17/2023)   Exercise Vital Sign    Days of Exercise per Week: 0 days    Minutes of Exercise per Session: Not on file  Stress: Stress Concern Present (01/17/2023)   [employer] of Occupational Health - Occupational Stress Questionnaire    Feeling of Stress : To some extent  Social Connections: Socially Isolated (01/17/2023)   Social Connection and Isolation Panel [NHANES]    Frequency of Communication with Friends and Family: Once a week    Frequency of Social Gatherings with Friends and Family: Never    Attends Religious Services:  Never    Database administrator or Organizations: No    Attends Engineer, structural: Not on file    Marital Status: Never married  Intimate Partner Violence: Not on file     Outpatient Medications Prior to Visit  Medication Sig Dispense Refill   albuterol (VENTOLIN HFA) 108 (90 Base) MCG/ACT inhaler Inhale 2 puffs into the lungs every 6 (six) hours as needed for wheezing or shortness of breath. 6.7 g 11   amLODipine (NORVASC) 2.5 MG tablet Take 1 tablet (2.5 mg total) by mouth 2 (two) times daily. If BP >130/>80 180 tablet 3   Cholecalciferol 1.25 MG (50000 UT) capsule Take 1 capsule (50,000 Units total) by mouth once a week for 8 weeks. 8 capsule 0   levocetirizine (XYZAL) 5 MG tablet Take 1 tablet (5 mg total) by mouth at bedtime as needed for allergies. 90 tablet 3   tiZANidine (ZANAFLEX) 4 MG tablet Take 1 tablet (4 mg total) by mouth at bedtime as needed for muscle spasms. 30 tablet 2   traZODone (DESYREL) 50 MG tablet Take 1 tablet (50 mg total) by mouth at bedtime. 90 tablet 3   No facility-administered medications prior to visit.    Allergies  Allergen Reactions   Latex Hives   Lexapro [Escitalopram]     Increased anger     ROS Review of Systems  Constitutional: Negative.   Eyes: Negative.   Respiratory:  Negative for chest tightness and shortness of breath.   Cardiovascular: Negative.   Musculoskeletal:  Positive for back pain.       Pain in the R upper leg with movement.  Neurological:  Negative for headaches.  Psychiatric/Behavioral: Negative.        Objective:    Physical Exam Constitutional:      Appearance: Normal appearance. She is obese.  HENT:     Head: Normocephalic.     Right Ear: Tympanic membrane normal.     Left Ear: Tympanic membrane normal.     Nose: Nose normal.     Mouth/Throat:     Mouth: Mucous membranes are moist.     Pharynx: Oropharynx is clear.  Eyes:     Extraocular Movements: Extraocular movements intact.      Conjunctiva/sclera: Conjunctivae normal.     Pupils: Pupils are equal, round, and reactive to light.  Cardiovascular:     Rate and Rhythm: Normal rate and regular rhythm.     Pulses: Normal pulses.     Heart sounds: Normal heart  sounds.  Pulmonary:     Effort: Pulmonary effort is normal. No respiratory distress.     Breath sounds: Normal breath sounds. No rhonchi.  Abdominal:     General: Bowel sounds are normal.     Palpations: Abdomen is soft. There is no mass.     Tenderness: There is no abdominal tenderness.     Hernia: No hernia is present.  Musculoskeletal:        General: Normal range of motion.     Cervical back: Neck supple. No tenderness.  Skin:    General: Skin is warm.     Capillary Refill: Capillary refill takes less than 2 seconds.  Neurological:     General: No focal deficit present.     Mental Status: She is alert and oriented to person, place, and time. Mental status is at baseline.  Psychiatric:        Mood and Affect: Mood normal.        Behavior: Behavior normal.        Thought Content: Thought content normal.        Judgment: Judgment normal.     BP 126/84   Pulse 69   Temp 98.1 F (36.7 C)   Ht  (1.651 m)   Wt 238 lb 6.4 oz (108.1 kg)   SpO2 98%   BMI 39.67 kg/m  Wt Readings from Last 3 Encounters:  01/17/23 238 lb 6.4 oz (108.1 kg)  01/15/23 224 lb (101.6 kg)  01/10/23 233 lb (105.7 kg)     Health Maintenance  Topic Date Due   COVID-19 Vaccine (3 - Pfizer risk series) 02/02/2023 (Originally 03/18/2020)   INFLUENZA VACCINE  05/16/2023   PAP SMEAR-Modifier  05/02/2024   DTaP/Tdap/Td (2 - Td or Tdap) 10/06/2031   COLONOSCOPY (Pts 45-50yrs Insurance coverage will need to be confirmed)  01/09/2033   Hepatitis C Screening  Completed   HIV Screening  Completed   HPV VACCINES  Aged Out    There are no preventive care reminders to display for this patient.  Lab Results  Component Value Date   TSH 1.67 10/03/2022   Lab Results   Component Value Date   WBC 7.5 01/15/2023   HGB 12.5 01/15/2023   HCT 39.1 01/15/2023   MCV 92.4 01/15/2023   PLT 299 01/15/2023   Lab Results  Component Value Date   NA 137 01/15/2023   K 3.6 01/15/2023   CO2 23 01/15/2023   GLUCOSE 102 (H) 01/15/2023   BUN 13 01/15/2023   CREATININE 1.05 (H) 01/15/2023   BILITOT 0.6 02/16/2022   ALKPHOS 50 02/16/2022   AST 15 02/16/2022   ALT 13 02/16/2022   PROT 6.9 02/16/2022   ALBUMIN 4.4 02/16/2022   CALCIUM 8.7 (L) 01/15/2023   ANIONGAP 7 01/15/2023   GFR 86.96 02/16/2022   Lab Results  Component Value Date   CHOL 192 02/16/2022   Lab Results  Component Value Date   HDL 67.40 02/16/2022   Lab Results  Component Value Date   LDLCALC 109 (H) 02/16/2022   Lab Results  Component Value Date   TRIG 81.0 02/16/2022   Lab Results  Component Value Date   CHOLHDL 3 02/16/2022   Lab Results  Component Value Date   HGBA1C 5.5 10/03/2022      Assessment & Plan:  Essential hypertension Assessment & Plan: Patient BP  Vitals:   01/17/23 1044 01/17/23 1133  BP: 138/88 126/84    in the office. Advised pt to  follow a low sodium and heart healthy diet. Continue amlodipine. Advised patient to check blood pressure at home bring the readings to the next appointment. If blood pressure remains elevated we will adjust medication.    Leg pain, right  Other orders -     Meloxicam; Take 1 tablet (7.5 mg total) by mouth daily.  Dispense: 30 tablet; Refill: 0    Follow-up: No follow-ups on file.   Kara Dies, NP

## 2023-01-17 NOTE — Patient Instructions (Signed)
Meloxicam 7.5 mg sent to pharmacy. Advised patient to check blood pressure at home daily for 2 weeks twice a day morning and evening and bring the numbers during the next appointment.  F/o 2 weeks with Dr. Volanda Napoleon or with me.

## 2023-01-18 ENCOUNTER — Telehealth: Payer: Self-pay

## 2023-01-18 NOTE — Transitions of Care (Post Inpatient/ED Visit) (Signed)
   01/18/2023  Name: Cassandra Cardenas MRN: [id number] DOB: 06/12/1976  Today's TOC FU Call Status: Today's TOC FU Call Status:: Unsuccessul Call (1st Attempt)  Attempted to reach the patient regarding the most recent Inpatient/ED visit.  Follow Up Plan: No further outreach attempts will be made at this time. We have been unable to contact the patient.  Pt seen in office by Kara Dies, NP 01/17/23.    Signature  Valentino Nose, RN

## 2023-01-28 DIAGNOSIS — M79604 Pain in right leg: Secondary | ICD-10-CM | POA: Insufficient documentation

## 2023-01-28 NOTE — Assessment & Plan Note (Signed)
Patient BP  Vitals:   01/17/23 1044 01/17/23 1133  BP: 138/88 126/84    in the office. Advised pt to follow a low sodium and heart healthy diet. Continue amlodipine. Advised patient to check blood pressure at home bring the readings to the next appointment. If blood pressure remains elevated we will adjust medication.

## 2023-01-28 NOTE — Assessment & Plan Note (Signed)
Started on meloxicam. Advised patient to continue to walk and use warm compress on the back. Will continue to monitor.

## 2023-02-01 ENCOUNTER — Other Ambulatory Visit: Payer: Self-pay

## 2023-02-03 NOTE — Patient Instructions (Incomplete)
It was a pleasure meeting you today. Thank you for allowing me to take part in your health care.  Our goals for today as we discussed include:   We will get some labs.  If they are abnormal or we need to do something about them, I will call you.  If they are normal, I will send you a message on MyChart (if it is active) or a letter in the mail.  If you don't hear from [place] in 2 weeks, please call the office at the number below.    Referral sent for Mammogram. Please call to schedule appointment. [facility] [address] [place], [place] [address] [phone]    PAP due 04/2024  Please arrive 15 minutes prior to your appointment.  Arrivals 5 minutes past your appointment time will need to be rescheduled.  This is to ensure that all patients are seen in a timely manner.  Thank you for your understanding and cooperation.   If you have any questions or concerns, please do not hesitate to call the office at [phone].  I look forward to our next visit and until then take care and stay safe.  Regards,   Dana Allan, MD   [facility]

## 2023-02-03 NOTE — Progress Notes (Signed)
SUBJECTIVE:   Chief Complaint  Patient presents with   Transfer of care   HPI Presents to clinic to transfer care.  Hypertension Asymptomatic.  Currently on amlodipine 5 mg nightly.  Does not check blood pressures at home.  Denies any headaches, visual changes, chest pain, shortness of breath or lower extremity edema.     PERTINENT PMH / PSH: Hypertension Obesity class I IDA   OBJECTIVE:  BP 118/72   Pulse 74   Temp 97.8 F (36.6 C)   Resp 16   Ht 5\' 5"  (1.651 m)   Wt 238 lb 6 oz (108.1 kg)   LMP 01/09/2023 (Approximate)   SpO2 99%   BMI 39.67 kg/m    Physical Exam Vitals reviewed.  Constitutional:      General: She is not in acute distress.    Appearance: She is obese. She is not ill-appearing.  HENT:     Head: Normocephalic.     Nose: Nose normal.  Eyes:     Conjunctiva/sclera: Conjunctivae normal.  Neck:     Thyroid: No thyromegaly or thyroid tenderness.  Cardiovascular:     Rate and Rhythm: Normal rate and regular rhythm.     Heart sounds: Normal heart sounds.  Pulmonary:     Effort: Pulmonary effort is normal.     Breath sounds: Normal breath sounds.  Abdominal:     General: Bowel sounds are normal.     Palpations: Abdomen is soft.  Musculoskeletal:        General: Normal range of motion.     Cervical back: Normal range of motion.  Neurological:     Mental Status: She is alert and oriented to person, place, and time. Mental status is at baseline.  Psychiatric:        Mood and Affect: Mood normal.        Behavior: Behavior normal.        Thought Content: Thought content normal.        Judgment: Judgment normal.     ASSESSMENT/PLAN:  Essential hypertension Assessment & Plan: Chronic.  Asymptomatic.  Well-controlled on current medication. Continue amlodipine 5 mg nightly Limit use of NSAIDs. Limit sodium intake Encourage healthy nutrition and increased activity Check labs   Orders: -     Comprehensive metabolic panel; Future -      TSH; Future -     amLODIPine Besylate; Take 2 tablets (5 mg total) by mouth at bedtime. If BP >130/>80  Dispense: 180 tablet; Refill: 3  Breast cancer screening by mammogram -     3D Screening Mammogram, Left and Right; Future  B12 deficiency Assessment & Plan: Check vitamin B12 level  Orders: -     Vitamin B12; Future  Obesity (BMI 30-39.9) Assessment & Plan: Elevated BMI Encouraged healthy diet and exercise. Check labs today  Orders: -     Hemoglobin A1c; Future -     Lipid panel; Future -     CBC with Differential/Platelet; Future  Vitamin D deficiency Assessment & Plan: Chronic.  Takes supplements.   Check vitamin D level If remains low continue vitamin D 1.25 mg weekly  Orders: -     VITAMIN D 25 Hydroxy (Vit-D Deficiency, Fractures); Future  Iron deficiency -     Iron, TIBC and Ferritin Panel; Future  HCM Last Pap smear 7/22, LSIL, HPV negative follows with OB/GYN at [facility]. Per ASCCP guidelines, repeat recommended in 1 year.  Due 07/23.  Request results. Colonoscopy up-to-date Mammogram due 05/24.  Referral placed today.  To call for appointment. Tdap up-to-date   PDMP reviewed  Return in about 6 months (around 08/06/2023), or if symptoms worsen or fail to improve, for PCP.  Dana Allan, MD

## 2023-02-04 ENCOUNTER — Encounter: Payer: Self-pay | Admitting: Family Medicine

## 2023-02-04 ENCOUNTER — Ambulatory Visit (INDEPENDENT_AMBULATORY_CARE_PROVIDER_SITE_OTHER): Payer: 59 | Admitting: Family Medicine

## 2023-02-04 VITALS — BP 118/72 | HR 74 | Temp 97.8°F | Resp 16 | Ht 65.0 in | Wt 238.4 lb

## 2023-02-04 DIAGNOSIS — E611 Iron deficiency: Secondary | ICD-10-CM | POA: Diagnosis not present

## 2023-02-04 DIAGNOSIS — E559 Vitamin D deficiency, unspecified: Secondary | ICD-10-CM

## 2023-02-04 DIAGNOSIS — I10 Essential (primary) hypertension: Secondary | ICD-10-CM

## 2023-02-04 DIAGNOSIS — E538 Deficiency of other specified B group vitamins: Secondary | ICD-10-CM | POA: Diagnosis not present

## 2023-02-04 DIAGNOSIS — E669 Obesity, unspecified: Secondary | ICD-10-CM | POA: Diagnosis not present

## 2023-02-04 DIAGNOSIS — Z1231 Encounter for screening mammogram for malignant neoplasm of breast: Secondary | ICD-10-CM

## 2023-02-04 MED ORDER — AMLODIPINE BESYLATE 2.5 MG PO TABS: 5.0000 mg | ORAL_TABLET | Freq: Every day | ORAL | 3 refills | Status: DC

## 2023-02-10 ENCOUNTER — Telehealth: Payer: Self-pay | Admitting: Family Medicine

## 2023-02-10 ENCOUNTER — Encounter: Payer: Self-pay | Admitting: Family Medicine

## 2023-02-10 DIAGNOSIS — Z1231 Encounter for screening mammogram for malignant neoplasm of breast: Secondary | ICD-10-CM | POA: Insufficient documentation

## 2023-02-10 NOTE — Assessment & Plan Note (Signed)
Chronic.  Asymptomatic.  Well-controlled on current medication. Continue amlodipine 5 mg nightly Limit use of NSAIDs. Limit sodium intake Encourage healthy nutrition and increased activity Check labs

## 2023-02-10 NOTE — Assessment & Plan Note (Signed)
Chronic.  Takes supplements.   Check vitamin D level If remains low continue vitamin D 1.25 mg weekly

## 2023-02-10 NOTE — Assessment & Plan Note (Signed)
Elevated BMI Encouraged healthy diet and exercise. Check labs today

## 2023-02-10 NOTE — Assessment & Plan Note (Signed)
Check vitamin B12 level 

## 2023-02-11 NOTE — Telephone Encounter (Signed)
Results requested

## 2023-03-22 ENCOUNTER — Other Ambulatory Visit
Admission: RE | Admit: 2023-03-22 | Discharge: 2023-03-22 | Disposition: A | Discharge status: Home / Self Care | Payer: 59 | Source: Ambulatory Visit | Attending: Family Medicine | Admitting: Family Medicine

## 2023-03-22 DIAGNOSIS — E611 Iron deficiency: Secondary | ICD-10-CM

## 2023-03-22 DIAGNOSIS — E559 Vitamin D deficiency, unspecified: Secondary | ICD-10-CM | POA: Diagnosis not present

## 2023-03-22 DIAGNOSIS — E669 Obesity, unspecified: Secondary | ICD-10-CM | POA: Diagnosis not present

## 2023-03-22 DIAGNOSIS — I10 Essential (primary) hypertension: Secondary | ICD-10-CM | POA: Diagnosis not present

## 2023-03-22 DIAGNOSIS — E538 Deficiency of other specified B group vitamins: Secondary | ICD-10-CM | POA: Diagnosis not present

## 2023-03-22 LAB — COMPREHENSIVE METABOLIC PANEL
ALT: 15 U/L (ref 0–44)
AST: 18 U/L (ref 15–41)
Albumin: 4.1 g/dL (ref 3.5–5.0)
Alkaline Phosphatase: 60 U/L (ref 38–126)
Anion gap: 9 (ref 5–15)
BUN: 12 mg/dL (ref 6–20)
CO2: 23 mmol/L (ref 22–32)
Calcium: 9.2 mg/dL (ref 8.9–10.3)
Chloride: 107 mmol/L (ref 98–111)
Creatinine, Ser: 0.99 mg/dL (ref 0.44–1.00)
GFR, Estimated: >60 mL/min (ref >60)
Glucose, Bld: 107 mg/dL — ABNORMAL HIGH (ref 70–99)
Potassium: 3.8 mmol/L (ref 3.5–5.1)
Sodium: 139 mmol/L (ref 135–145)
Total Bilirubin: 0.3 mg/dL (ref 0.3–1.2)
Total Protein: 7.3 g/dL (ref 6.5–8.1)

## 2023-03-22 LAB — CBC WITH DIFFERENTIAL/PLATELET
Abs Immature Granulocytes: 0.04 10*3/uL (ref 0.00–0.07)
Basophils Absolute: 0 10*3/uL (ref 0.0–0.1)
Basophils Relative: 1 %
Eosinophils Absolute: 0.2 10*3/uL (ref 0.0–0.5)
Eosinophils Relative: 2 %
HCT: 38.3 % (ref 36.0–46.0)
Hemoglobin: 12.5 g/dL (ref 12.0–15.0)
Immature Granulocytes: 1 %
Lymphocytes Relative: 33 %
Lymphs Abs: 2.7 10*3/uL (ref 0.7–4.0)
MCH: 30 pg (ref 26.0–34.0)
MCHC: 32.6 g/dL (ref 30.0–36.0)
MCV: 92.1 fL (ref 80.0–100.0)
Monocytes Absolute: 0.6 10*3/uL (ref 0.1–1.0)
Monocytes Relative: 8 %
Neutro Abs: 4.7 10*3/uL (ref 1.7–7.7)
Neutrophils Relative %: 55 %
Platelets: 342 10*3/uL (ref 150–400)
RBC: 4.16 MIL/uL (ref 3.87–5.11)
RDW: 12.9 % (ref 11.5–15.5)
WBC: 8.2 10*3/uL (ref 4.0–10.5)
nRBC: 0 % (ref 0.0–0.2)

## 2023-03-22 LAB — TSH: TSH: 4.256 u[IU]/mL (ref 0.350–4.500)

## 2023-03-22 LAB — LIPID PANEL
Cholesterol: 193 mg/dL (ref 0–200)
HDL: 83 mg/dL (ref >40)
LDL Cholesterol: 98 mg/dL (ref 0–99)
Total CHOL/HDL Ratio: 2.3 RATIO
Triglycerides: 62 mg/dL (ref <150)
VLDL: 12 mg/dL (ref 0–40)

## 2023-03-23 LAB — HEMOGLOBIN A1C
Hgb A1c MFr Bld: 5.2 % (ref 4.8–5.6)
Mean Plasma Glucose: 102.54 mg/dL

## 2023-03-23 LAB — VITAMIN B12: Vitamin B-12: 187 pg/mL (ref 180–914)

## 2023-03-23 LAB — VITAMIN D 25 HYDROXY (VIT D DEFICIENCY, FRACTURES): Vit D, 25-Hydroxy: 32.42 ng/mL (ref 30–100)

## 2023-04-01 ENCOUNTER — Encounter: Payer: Self-pay | Admitting: Family Medicine

## 2023-04-23 ENCOUNTER — Ambulatory Visit
Admission: RE | Admit: 2023-04-23 | Discharge: 2023-04-23 | Disposition: A | Discharge status: Home / Self Care | Payer: 59 | Source: Ambulatory Visit | Attending: Family Medicine | Admitting: Family Medicine

## 2023-04-23 DIAGNOSIS — Z1231 Encounter for screening mammogram for malignant neoplasm of breast: Secondary | ICD-10-CM | POA: Insufficient documentation

## 2023-06-24 ENCOUNTER — Emergency Department
Admission: EM | Admit: 2023-06-24 | Discharge: 2023-06-24 | Disposition: A | Discharge status: Home / Self Care | Payer: 59 | Attending: Emergency Medicine | Admitting: Emergency Medicine

## 2023-06-24 ENCOUNTER — Other Ambulatory Visit: Payer: Self-pay

## 2023-06-24 ENCOUNTER — Emergency Department: Payer: 59

## 2023-06-24 ENCOUNTER — Ambulatory Visit: Payer: 59 | Admitting: Family Medicine

## 2023-06-24 ENCOUNTER — Telehealth: Payer: Self-pay | Admitting: Family Medicine

## 2023-06-24 ENCOUNTER — Encounter: Payer: Self-pay | Admitting: Emergency Medicine

## 2023-06-24 DIAGNOSIS — M79605 Pain in left leg: Secondary | ICD-10-CM | POA: Insufficient documentation

## 2023-06-24 DIAGNOSIS — M79662 Pain in left lower leg: Secondary | ICD-10-CM | POA: Diagnosis not present

## 2023-06-24 DIAGNOSIS — M7989 Other specified soft tissue disorders: Secondary | ICD-10-CM | POA: Diagnosis not present

## 2023-06-24 DIAGNOSIS — M25572 Pain in left ankle and joints of left foot: Secondary | ICD-10-CM | POA: Diagnosis not present

## 2023-06-24 DIAGNOSIS — I10 Essential (primary) hypertension: Secondary | ICD-10-CM | POA: Diagnosis not present

## 2023-06-24 DIAGNOSIS — R2689 Other abnormalities of gait and mobility: Secondary | ICD-10-CM | POA: Diagnosis not present

## 2023-06-24 LAB — CBC
HCT: 38.8 % (ref 36.0–46.0)
Hemoglobin: 12.8 g/dL (ref 12.0–15.0)
MCH: 29.7 pg (ref 26.0–34.0)
MCHC: 33 g/dL (ref 30.0–36.0)
MCV: 90 fL (ref 80.0–100.0)
Platelets: 295 10*3/uL (ref 150–400)
RBC: 4.31 MIL/uL (ref 3.87–5.11)
RDW: 12.8 % (ref 11.5–15.5)
WBC: 7.6 10*3/uL (ref 4.0–10.5)
nRBC: 0 % (ref 0.0–0.2)

## 2023-06-24 LAB — COMPREHENSIVE METABOLIC PANEL
ALT: 13 U/L (ref 0–44)
AST: 18 U/L (ref 15–41)
Albumin: 4.1 g/dL (ref 3.5–5.0)
Alkaline Phosphatase: 52 U/L (ref 38–126)
Anion gap: 8 (ref 5–15)
BUN: 12 mg/dL (ref 6–20)
CO2: 21 mmol/L — ABNORMAL LOW (ref 22–32)
Calcium: 8.6 mg/dL — ABNORMAL LOW (ref 8.9–10.3)
Chloride: 107 mmol/L (ref 98–111)
Creatinine, Ser: 0.78 mg/dL (ref 0.44–1.00)
GFR, Estimated: >60 mL/min (ref >60)
Glucose, Bld: 109 mg/dL — ABNORMAL HIGH (ref 70–99)
Potassium: 3.8 mmol/L (ref 3.5–5.1)
Sodium: 136 mmol/L (ref 135–145)
Total Bilirubin: 0.5 mg/dL (ref 0.3–1.2)
Total Protein: 7.1 g/dL (ref 6.5–8.1)

## 2023-06-24 LAB — D-DIMER, QUANTITATIVE: D-Dimer, Quant: 0.32 ug{FEU}/mL (ref 0.00–0.50)

## 2023-06-24 MED ORDER — KETOROLAC TROMETHAMINE 15 MG/ML IJ SOLN
15.0000 mg | Freq: Once | INTRAMUSCULAR | Status: AC
  Administered 2023-06-24: 15 mg via INTRAMUSCULAR
  Filled 2023-06-24: qty 1

## 2023-06-24 MED ORDER — KETOROLAC TROMETHAMINE 15 MG/ML IJ SOLN: 15.0000 mg | Freq: Once | INTRAMUSCULAR | Status: DC

## 2023-06-24 NOTE — Telephone Encounter (Signed)
Called pt and she is going to the ER to be evaluated.

## 2023-06-24 NOTE — ED Notes (Signed)
Blue top sent to lab. 

## 2023-06-24 NOTE — Discharge Instructions (Signed)
Your blood work, ultrasound, and x-ray did not reveal any acute abnormalities.  You may wear the Ace wrap during the day for extra support, please remove it at night.  Please return for any new, worsening, or change in symptoms or other concerns.  Also, wear supportive shoes as we discussed.  It was a pleasure caring for you today.

## 2023-06-24 NOTE — Telephone Encounter (Signed)
  Symptoms: A week now and has gotten worse. Possible blood clot in leg    Attributing factors (medication changes, positional changes, etc. ) No changes in medication     Duration :     Pain Scale?  On 1-10 how woiuld you rate your pain? What makes it better or worse? 10     Blood pressure            Pulse             Temp

## 2023-06-24 NOTE — Telephone Encounter (Signed)
Left message to return call to our office.  Called pt to see if she wants to be seen at another office for this.

## 2023-06-24 NOTE — ED Triage Notes (Addendum)
Pt here with left leg pain. Pt denies injury or fall. Pt having ankle swelling. Pt also states she is having pain in her calf, pain from the knee down.

## 2023-06-24 NOTE — Group Note (Deleted)

## 2023-06-24 NOTE — ED Provider Notes (Signed)
[facility] Provider Note    Event Date/Time   First MD Initiated Contact with Patient 06/24/23 1256     (approximate)   History   Leg Pain   HPI  Cassandra Cardenas is a 47 y.o. female who presents today for evaluation of left calf pain for the past 3 days.  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  She denies any injury sustained.  She denies any personal or family history of PE or DVT.  She is also noticed mild swelling to her ankle but denies any injuries or changes in her skin color there.  She reports that she takes amlodipine daily.  No chest pain or shortness of breath.  No fevers or chills.  She is still able to ambulate.  Patient Active Problem List   Diagnosis Date Noted   Breast cancer screening by mammogram 02/10/2023   Leg pain, right 01/28/2023   Adjustment disorder with depressed mood 06/14/2022   Irritability and anger 06/14/2022   Social anxiety disorder 06/14/2022   Anxiety 06/14/2022   Grief 06/14/2022   Iron deficiency 12/20/2020   Vitamin D deficiency 06/24/2020   B12 deficiency 06/24/2020   ANA positive 06/24/2020   Kidney stones 05/03/2020   Chronic pain of right knee 08/13/2019   Cervicalgia 08/13/2019   Obesity (BMI 30-39.9) 06/05/2019   Screen for colon cancer 06/05/2019   Essential hypertension 06/05/2019          Physical Exam   Triage Vital Signs: ED Triage Vitals  Encounter Vitals Group     BP --      Systolic BP Percentile --      Diastolic BP Percentile --      Pulse Rate 06/24/23 1150 73     Resp 06/24/23 1150 17     Temp 06/24/23 1150 98.3 F (36.8 C)     Temp Source 06/24/23 1150 Oral     SpO2 06/24/23 1150 98 %     Weight 06/24/23 1148 238 lb 5.1 oz (108.1 kg)     Height 06/24/23 1148 5\' 5"  (1.651 m)     Head Circumference --      Peak Flow --      Pain Score 06/24/23 1148 10     Pain Loc --      Pain Education --      Exclude  from Growth Chart --     Most recent vital signs: Vitals:   06/24/23 1150 06/24/23 1301  BP:  (!) 146/82  Pulse: 73   Resp: 17   Temp: 98.3 F (36.8 C)   SpO2: 98%     Physical Exam Vitals and nursing note reviewed.  Constitutional:      General: Awake and alert. No acute distress.    Appearance: Normal appearance.   HENT:     Head: Normocephalic and atraumatic.     Mouth: Mucous membranes are moist.  Eyes:     General: PERRL. Normal EOMs        Right eye: No discharge.        Left eye: No discharge.     Conjunctiva/sclera: Conjunctivae normal.  Cardiovascular:     Rate and Rhythm: Normal rate and regular rhythm.     Pulses: Normal pulses.  Pulmonary:     Effort: Pulmonary effort is normal. No respiratory distress.     Breath sounds: Normal breath sounds.  Abdominal:  Abdomen is soft. There is no abdominal tenderness. No rebound or guarding. No distention. Musculoskeletal:        General: No swelling. Normal range of motion.     Cervical back: Normal range of motion and neck supple.  Left lower extremity: Trace edema noted to ankle without tenderness to palpation in this area.  She has mild tenderness to palpation to her calf, without hematoma, skin changes, crepitus, or pitting edema noted.  No tenderness to popliteal fossa area.  Normal and full range of motion of hip, knee, ankle.  Normal distal pulses.  Normal capillary refill.  Sensation intact light touch and equal to opposite.  Compartment soft compressible throughout. Skin:    General: Skin is warm and dry.     Capillary Refill: Capillary refill takes less than 2 seconds.     Findings: No rash.  Neurological:     Mental Status: The patient is awake and alert.      ED Results / Procedures / Treatments   Labs (all labs ordered are listed, but only abnormal results are displayed) Labs Reviewed  COMPREHENSIVE METABOLIC PANEL - Abnormal; Notable for the following components:      Result Value   CO2 21 (*)     Glucose, Bld 109 (*)    Calcium 8.6 (*)    All other components within normal limits  CBC  D-DIMER, QUANTITATIVE     EKG     RADIOLOGY I independently reviewed and interpreted imaging and agree with radiologists findings.     PROCEDURES:  Critical Care performed:   Procedures   MEDICATIONS ORDERED IN ED: Medications  ketorolac (TORADOL) 15 MG/ML injection 15 mg (15 mg Intramuscular Given 06/24/23 1330)     IMPRESSION / MDM / ASSESSMENT AND PLAN / ED COURSE  I reviewed the triage vital signs and the nursing notes.   Differential diagnosis includes, but is not limited to, calf strain, DVT, Baker's cyst, swelling from amlodipine.  Patient is awake and alert, hemodynamically stable and afebrile.  She is nontoxic in appearance.  She has mild tenderness to palpation to her left calf, though compartments are soft and compressible, sensation intact light touch throughout, normal distal pulses, normal capillary refill, not consistent with compartment syndrome.  She has no constitutional symptoms, she is hemodynamically stable, she has no fever, no warmth erythema, do not suspect infectious etiology.  She does have trace edema to her ankle, though x-ray was obtained in triage and is negative for any acute findings.  It is possibly a side effect from her amlodipine.  Labs are overall reassuring.  D-dimer was negative, do not suspect DVT, however ultrasound ordered from triage to also evaluate for Baker's cyst which was negative.  Patient is reassured by these findings.  Recommended Ace wrap fracture support and remove at night.  Also discussed wearing proper and supportive footwear.  We discussed return precautions and outpatient follow-up.  Patient or stands and agrees with plan.  She was discharged in stable condition.  Patient's presentation is most consistent with acute complicated illness / injury requiring diagnostic workup.    FINAL CLINICAL IMPRESSION(S) / ED DIAGNOSES    Final diagnoses:  Left leg pain     Rx / DC Orders   ED Discharge Orders     None        Note:  This document was prepared using Dragon voice recognition software and may include unintentional dictation errors.   Jackelyn Hoehn, PA-C 06/24/23 1501  Pilar Jarvis, MD 06/24/23 [phone]

## 2023-06-27 ENCOUNTER — Telehealth: Payer: Self-pay

## 2023-06-27 NOTE — Transitions of Care (Post Inpatient/ED Visit) (Signed)
Unable to reach patient by phone and left v/m requesting call back at [phone].       06/27/2023  Name: Cassandra Cardenas MRN: [id number] DOB: February 11, 1976  Today's TOC FU Call Status: Today's TOC FU Call Status:: Unsuccessful Call (1st Attempt) Unsuccessful Call (1st Attempt) Date: 06/27/23  Attempted to reach the patient regarding the most recent Inpatient/ED visit.  Follow Up Plan: Additional outreach attempts will be made to reach the patient to complete the Transitions of Care (Post Inpatient/ED visit) call.   Signature  Lewanda Rife, LPN

## 2023-06-28 NOTE — Transitions of Care (Post Inpatient/ED Visit) (Signed)
I spoke with pt; pt seen 06/24/23 at [facility] ED for lt leg pain; no known injury and pt said ED could not give definite dx for the legpain.  Pt said she is still having lt ankle pain; pt has ace wrap and crutches that were given to pt before leaving ED. UC & ED  precautions given and pt voiced understanding.did warm transfer to [facility] for ED FU appt. Sending note to Dr Dana Allan.      06/28/2023  Name: Cassandra Cardenas MRN: [id number] DOB: 11-16-1975  Today's TOC FU Call Status: Today's TOC FU Call Status:: Successful TOC FU Call Completed Unsuccessful Call (1st Attempt) Date: 06/27/23 Unsuccessful Call (2nd Attempt) Date: 06/28/23 [facility] FU Call Complete Date: 06/28/23 Patient's Name and Date of Birth confirmed.  Transition Care Management Follow-up Telephone Call Date of Discharge: 06/24/23 Discharge Facility: [facility]) Type of Discharge: Emergency Department Reason for ED Visit: Other: (pt seen 06/24/23 at [facility] ED for lt leg pain; no known injury  and pt said ED could  not give definite dx for the legpain.) How have you been since you were released from the hospital?: Better (pt can ambulate but still has lt ankle pain and hurts to bear full weight on ankle.) Any questions or concerns?: No  Items Reviewed: Did you receive and understand the discharge instructions provided?: Yes Medications obtained,verified, and reconciled?: Yes (Medications Reviewed) Any new allergies since your discharge?: No Dietary orders reviewed?: NA Do you have support at home?: Yes People in Home: child(ren), adult Name of Support/Comfort Primary Source: [place]  Medications Reviewed Today: Medications Reviewed Today     Reviewed by Patience Musca, LPN (Licensed Practical Nurse) on 06/28/23 at 1136  Med List Status: <None>   Medication Order Taking? Sig Documenting Provider Last Dose Status Informant  albuterol (VENTOLIN HFA) 108 (90 Base) MCG/ACT inhaler [id number] No  Inhale 2 puffs into the lungs every 6 (six) hours as needed for wheezing or shortness of breath. McLean-Scocuzza, Pasty Spillers, MD Taking Active   amLODipine (NORVASC) 2.5 MG tablet [id number]  Take 2 tablets (5 mg total) by mouth at bedtime. If BP >130/>80 Dana Allan, MD  Active   Cholecalciferol 1.25 MG (50000 UT) capsule [id number] No Take 1 capsule (50,000 Units total) by mouth once a week for 8 weeks. Allegra Grana, FNP Taking Active   levocetirizine (XYZAL) 5 MG tablet [id number] No Take 1 tablet (5 mg total) by mouth at bedtime as needed for allergies. McLean-Scocuzza, Pasty Spillers, MD Taking Active             Home Care and Equipment/Supplies: Were Home Health Services Ordered?: NA Any new equipment or medical supplies ordered?: Yes (pt was given ace wrap and crutches at ED) Were you able to get the equipment/medical supplies?: Yes Do you have any questions related to the use of the equipment/supplies?: No  Functional Questionnaire: Do you need assistance with bathing/showering or dressing?: No Do you need assistance with meal preparation?: No Do you need assistance with eating?: No Do you have difficulty maintaining continence: No Do you need assistance with getting out of bed/getting out of a chair/moving?: No Do you have difficulty managing or taking your medications?: No  Follow up appointments reviewed: PCP Follow-up appointment confirmed?: No (did warm transfer for pt to [facility] for ED FU.) MD Provider Line [phone] Given: Yes Specialist Hospital Follow-up appointment confirmed?: NA Do you need transportation to your follow-up appointment?: No Do you  understand care options if your condition(s) worsen?: Yes-patient verbalized understanding    SIGNATURE Lewanda Rife, LPN

## 2023-06-28 NOTE — Transitions of Care (Post Inpatient/ED Visit) (Signed)
Unable to reach patient by phone and left v/m requesting call back at [phone].       06/28/2023  Name: Cassandra Cardenas MRN: [id number] DOB: June 27, 1976  Today's TOC FU Call Status: Today's TOC FU Call Status:: Unsuccessful Call (2nd Attempt) Unsuccessful Call (1st Attempt) Date: 06/27/23 Unsuccessful Call (2nd Attempt) Date: 06/28/23  Attempted to reach the patient regarding the most recent Inpatient/ED visit.  Follow Up Plan: Additional outreach attempts will be made to reach the patient to complete the Transitions of Care (Post Inpatient/ED visit) call.   Signature Lewanda Rife, LPN

## 2023-07-04 ENCOUNTER — Encounter: Payer: Self-pay | Admitting: Family Medicine

## 2023-07-04 ENCOUNTER — Ambulatory Visit (INDEPENDENT_AMBULATORY_CARE_PROVIDER_SITE_OTHER): Payer: 59 | Admitting: Family Medicine

## 2023-07-04 DIAGNOSIS — M79605 Pain in left leg: Secondary | ICD-10-CM | POA: Diagnosis not present

## 2023-07-04 DIAGNOSIS — I10 Essential (primary) hypertension: Secondary | ICD-10-CM

## 2023-07-04 NOTE — Patient Instructions (Signed)
It was a pleasure meeting you today. Thank you for allowing me to take part in your health care.  Our goals for today as we discussed include:  Recommend to continue compression stockings daily Elevate legs when able Limit sodium intake Increase activity Avoid NSAIDs, Naproxen, Ibuprofen, Aleve, Voltaren if taking  Recent blood work normal Recent ultrasound negative for blood clot in left leg Recent left ankle xray showed some chronic inflammation Recommend calf stretches daily Recommend supportive footwear  For weight loss management Look at [employer] [facility] website to see if this may be an option for you with your weight loss. [facility] [address] [place] [phone]   Can check out [facility] MD as well  If you have any questions or concerns, please do not hesitate to call the office at [phone].  I look forward to our next visit and until then take care and stay safe.  Regards,   Dana Allan, MD   [facility]

## 2023-07-04 NOTE — Progress Notes (Signed)
SUBJECTIVE:   Chief Complaint  Patient presents with   Follow-up   HPI Presents to clinic for ED follow up  Evaluated at [facility] ED 09/09 for left leg pain.  Left anke xray notable for mild malleolar soft tissue swelling, medial > lateral, no acute fracture noted.  Lower extremity doppler  negative for DVT and Bakers cyst. Labs reassuring.  She was given Toradol IM and recommended ACE wraps.  Since then patient reports swelling has improved.  Pain as resolved.  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  Weight management She is interested in weight loss.  Currently has hectic schedule with work and school.  Has increased stress and fatigue. Nutrition has been poor.  Has not developed good nutrition habits.  PERTINENT PMH / PSH: Obesity class 3 HTN   OBJECTIVE:  BP 116/74   Pulse 72   Temp 97.9 F (36.6 C)   Resp 16   Ht 5\' 5"  (1.651 m)   Wt 246 lb (111.6 kg)   LMP 07/02/2023 (Exact Date)   SpO2 97%   BMI 40.94 kg/m    Physical Exam Vitals reviewed.  Constitutional:      General: She is not in acute distress.    Appearance: Normal appearance. She is obese. She is not ill-appearing, toxic-appearing or diaphoretic.  HENT:     Mouth/Throat:     Mouth: Mucous membranes are moist.  Eyes:     General:        Right eye: No discharge.        Left eye: No discharge.     Conjunctiva/sclera: Conjunctivae normal.  Cardiovascular:     Rate and Rhythm: Normal rate.  Pulmonary:     Effort: Pulmonary effort is normal.  Musculoskeletal:        General: No swelling. Normal range of motion.     Right lower leg: No edema.     Left lower leg: No edema.  Skin:    General: Skin is warm and dry.  Neurological:     General: No focal deficit present.     Mental Status: She is alert and oriented to person, place, and time. Mental status is at baseline.  Psychiatric:        Mood and Affect: Mood normal.         Behavior: Behavior normal.        Thought Content: Thought content normal.        Judgment: Judgment normal.        07/04/2023    1:43 PM 02/04/2023   11:08 AM 01/17/2023   10:42 AM 10/03/2022   10:05 AM 06/14/2022   11:13 AM  Depression screen PHQ 2/9  Decreased Interest 3 3 0 0 3  Down, Depressed, Hopeless 2 1 0 0 3  PHQ - 2 Score 5 4 0 0 6  Altered sleeping 2 1 0  1  Tired, decreased energy 3 3 0  1  Change in appetite 2 3 0  1  Feeling bad or failure about yourself  0 3 0  1  Trouble concentrating 1 0 0  0  Moving slowly or fidgety/restless 0 0 0  0  Suicidal thoughts 0 0 0  0  PHQ-9 Score 13 14 0  10  Difficult doing work/chores Somewhat difficult Somewhat difficult Not difficult at all  Somewhat difficult  07/04/2023    1:43 PM 02/04/2023   11:08 AM 01/17/2023   10:42 AM 08/17/2021    3:00 PM  GAD 7 : Generalized Anxiety Score  Nervous, Anxious, on Edge 1 3 0 3  Control/stop worrying 0 3 0 0  Worry too much - different things 1 3 0 1  Trouble relaxing 2 3 0 1  Restless 0 0 0 0  Easily annoyed or irritable 2 3 0 3  Afraid - awful might happen 1 2 0 0  Total GAD 7 Score 7 17 0 8  Anxiety Difficulty Somewhat difficult Somewhat difficult Not difficult at all Somewhat difficult    ASSESSMENT/PLAN:  Left leg pain Assessment & Plan: Has improved with elevation and compression Mild malleolar swelling noted on recent left ankle xray likely secondary to CCB Recommend continued use of compression stockings  Weight loss would help, provided weight loss resources Elevate legs when able Follow up as needed   Morbid obesity (HCC) Assessment & Plan: Elevated BMI Encouraged healthy diet and exercise. Weight loss resources provided   Essential hypertension Assessment & Plan: Chronic.  Asymptomatic.  Well-controlled on current medication. Continue amlodipine 5 mg nightly,  likely contributing to mild lower ankle swelling noted on recent lt ankle xray Limit use of  NSAIDs. Limit sodium intake Encourage healthy nutrition and increased activity    Note provided to patient to resume clinical  PDMP reviewed  Return if symptoms worsen or fail to improve, for PCP.  Dana Allan, MD

## 2023-07-11 ENCOUNTER — Encounter: Payer: 59 | Admitting: Family Medicine

## 2023-07-13 ENCOUNTER — Encounter: Payer: Self-pay | Admitting: Family Medicine

## 2023-07-13 DIAGNOSIS — M79605 Pain in left leg: Secondary | ICD-10-CM | POA: Insufficient documentation

## 2023-07-13 NOTE — Assessment & Plan Note (Signed)
Chronic.  Asymptomatic.  Well-controlled on current medication. Continue amlodipine 5 mg nightly,  likely contributing to mild lower ankle swelling noted on recent lt ankle xray Limit use of NSAIDs. Limit sodium intake Encourage healthy nutrition and increased activity

## 2023-07-13 NOTE — Assessment & Plan Note (Addendum)
Has improved with elevation and compression Mild malleolar swelling noted on recent left ankle xray likely secondary to CCB Recommend continued use of compression stockings  Weight loss would help, provided weight loss resources Elevate legs when able Follow up as needed

## 2023-07-13 NOTE — Assessment & Plan Note (Signed)
Elevated BMI Encouraged healthy diet and exercise. Weight loss resources provided

## 2023-08-01 ENCOUNTER — Telehealth: Payer: 59 | Admitting: Family Medicine

## 2023-08-01 DIAGNOSIS — R42 Dizziness and giddiness: Secondary | ICD-10-CM

## 2023-08-01 NOTE — Progress Notes (Signed)
Based on what you shared with me, I feel your condition warrants further evaluation as soon as possible at an Emergency department.    NOTE: There will be NO CHARGE for this eVisit   If you are having a true medical emergency please call 911.      Emergency Department-[facility]  Get Driving Directions  [phone]  [address]  [place], [place] [address]  Open 24/7/365      [place] [facility] [address]  Get Driving Directions  [address]  [place], [place] [address]  Open 24/7/365    Emergency Department- [facility]  Get Driving Directions  [phone]  [address]  [place], [place] [address]  Open 24/7/365      [facility]  Get Driving Directions  [phone]  [address]  [place], [place] [address]  Open 24/7/365    [place]  Emergency Department- [facility]  Get Driving Directions  [phone]  [address]  [place], [place] [address]  Open 24/7/365    [place]  Emergency Department- [facility] [place]  Get Driving Directions  [address]  [place], [place] [address]  Open 24/7/365    [place]  Emergency Department- [facility]  Get Driving Directions  [phone]  [address]  [place], [place] [address]  Open 24/7/365    

## 2023-08-06 ENCOUNTER — Other Ambulatory Visit: Payer: Self-pay

## 2023-08-06 ENCOUNTER — Telehealth: Payer: Self-pay | Admitting: Family Medicine

## 2023-08-06 ENCOUNTER — Encounter: Payer: Self-pay | Admitting: Family Medicine

## 2023-08-06 ENCOUNTER — Ambulatory Visit (INDEPENDENT_AMBULATORY_CARE_PROVIDER_SITE_OTHER): Payer: 59 | Admitting: Family Medicine

## 2023-08-06 DIAGNOSIS — E559 Vitamin D deficiency, unspecified: Secondary | ICD-10-CM | POA: Diagnosis not present

## 2023-08-06 DIAGNOSIS — Z8639 Personal history of other endocrine, nutritional and metabolic disease: Secondary | ICD-10-CM

## 2023-08-06 DIAGNOSIS — F39 Unspecified mood [affective] disorder: Secondary | ICD-10-CM | POA: Diagnosis not present

## 2023-08-06 DIAGNOSIS — Z6841 Body Mass Index (BMI) 40.0 and over, adult: Secondary | ICD-10-CM

## 2023-08-06 MED ORDER — ESCITALOPRAM OXALATE 5 MG PO TABS
5.0000 mg | ORAL_TABLET | Freq: Every day | ORAL | 0 refills | Status: DC
  Filled 2023-08-06: qty 30, 30d supply, fill #0

## 2023-08-06 MED ORDER — CHOLECALCIFEROL 1.25 MG (50000 UT) PO CAPS
50000.0000 [IU] | ORAL_CAPSULE | ORAL | 0 refills | Status: AC
Start: 2023-08-06 — End: ?
  Filled 2023-08-06: qty 8, 56d supply, fill #0

## 2023-08-06 NOTE — Telephone Encounter (Signed)
Pt called stating she would like a copy of her flu shot and she will pick it up today

## 2023-08-06 NOTE — Telephone Encounter (Signed)
Patient just returned phone call. Can you call her back when you get a minute. Her number is [phone].

## 2023-08-06 NOTE — Telephone Encounter (Signed)
Called and spoke to pt she will call back with the date she got her flu shot so we can update her immunization record.

## 2023-08-06 NOTE — Patient Instructions (Addendum)
It was a pleasure meeting you today. Thank you for allowing me to take part in your health care.  Our goals for today as we discussed include:  Start Lexapro 5 mg daily Notify next week if any symptoms  Follow up in 2 weeks  Restart Vitamin D 1.25 mg weekly.  Refill sent  Look at [facility] website to see if this may be an option for you with your weight loss. [facility] [address] [place] [phone]   [place], [place]  [phone]  This is a list of the screening recommended for you and due dates:  Health Maintenance  Topic Date Due   COVID-19 Vaccine (3 - Pfizer risk series) 03/18/2020   Flu Shot  01/13/2024*   Pap with HPV screening  05/02/2024   DTaP/Tdap/Td vaccine (2 - Td or Tdap) 10/06/2031   Colon Cancer Screening  01/09/2033   Hepatitis C Screening  Completed   HIV Screening  Completed   HPV Vaccine  Aged Out  *Topic was postponed. The date shown is not the original due date.      If you have any questions or concerns, please do not hesitate to call the office at [phone].  I look forward to our next visit and until then take care and stay safe.  Regards,   Dana Allan, MD   [facility]

## 2023-08-06 NOTE — Telephone Encounter (Signed)
Left message to return call to our office.  

## 2023-08-06 NOTE — Progress Notes (Unsigned)
SUBJECTIVE:   Chief Complaint  Patient presents with   Medical Management of Chronic Issues    6 month follow up weight   HPI Presents to clinic for follow up chronic disease management  Discussed the use of AI scribe software for clinical note transcription with the patient, who gave verbal consent to proceed.  History of Present Illness The patient presents for a follow-up visit with concerns about their weight. They report a recent weight loss of four pounds, which they attribute to a decreased appetite and increased water intake. They express a desire to start a weight loss medication, specifically an injectable, but are unsure if it is covered by their insurance. They also mention considering metformin, but are unsure about its effects.  The patient admits to not eating regularly or having a large appetite, often consuming small portions of food such as oatmeal or a wrap during the day, and sometimes skipping dinner. They have made some dietary changes, including increased consumption of baked chicken and decreased salt intake.  In addition to their weight concerns, the patient reports high levels of depression and anxiety. They attribute their depression to the loss of their brother two years ago and their anxiety to stress from school. They have previously tried Lexapro, which they report made them angry, but are open to trying it again at a low dose.  The patient also mentions a history of low vitamin D levels, which they believe contributes to their fatigue. They are unsure if they are currently taking a high-dose vitamin D supplement that was previously prescribed.    PERTINENT PMH / PSH: As above  OBJECTIVE:  BP 114/76   Pulse 74   Temp 98 F (36.7 C)   Resp 16   Ht 5\' 5"  (1.651 m)   Wt 242 lb 8 oz (110 kg)   LMP 07/29/2023 (Approximate)   SpO2 98%   BMI 40.35 kg/m    Physical Exam Vitals reviewed.  Constitutional:      General: She is not in acute distress.     Appearance: Normal appearance. She is obese. She is not ill-appearing, toxic-appearing or diaphoretic.  Eyes:     General:        Right eye: No discharge.        Left eye: No discharge.     Conjunctiva/sclera: Conjunctivae normal.  Cardiovascular:     Rate and Rhythm: Normal rate and regular rhythm.     Heart sounds: Normal heart sounds.  Pulmonary:     Effort: Pulmonary effort is normal.     Breath sounds: Normal breath sounds.  Abdominal:     General: Bowel sounds are normal.  Musculoskeletal:        General: Normal range of motion.  Skin:    General: Skin is warm and dry.  Neurological:     General: No focal deficit present.     Mental Status: She is alert and oriented to person, place, and time. Mental status is at baseline.  Psychiatric:        Mood and Affect: Mood normal.        Behavior: Behavior normal.        Thought Content: Thought content normal.        Judgment: Judgment normal.        08/06/2023    8:20 AM 07/04/2023    1:43 PM 02/04/2023   11:08 AM 01/17/2023   10:42 AM 10/03/2022   10:05 AM  Depression screen PHQ 2/9  Decreased Interest 3 3 3  0 0  Down, Depressed, Hopeless 3 2 1  0 0  PHQ - 2 Score 6 5 4  0 0  Altered sleeping 3 2 1  0   Tired, decreased energy 3 3 3  0   Change in appetite 3 2 3  0   Feeling bad or failure about yourself  1 0 3 0   Trouble concentrating 3 1 0 0   Moving slowly or fidgety/restless 1 0 0 0   Suicidal thoughts 0 0 0 0   PHQ-9 Score 20 13 14  0   Difficult doing work/chores Very difficult Somewhat difficult Somewhat difficult Not difficult at all       08/06/2023    8:21 AM 07/04/2023    1:43 PM 02/04/2023   11:08 AM 01/17/2023   10:42 AM  GAD 7 : Generalized Anxiety Score  Nervous, Anxious, on Edge 3 1 3  0  Control/stop worrying 3 0 3 0  Worry too much - different things 3 1 3  0  Trouble relaxing 3 2 3  0  Restless 3 0 0 0  Easily annoyed or irritable 3 2 3  0  Afraid - awful might happen 2 1 2  0  Total GAD 7 Score 20 7  17  0  Anxiety Difficulty Very difficult Somewhat difficult Somewhat difficult Not difficult at all    ASSESSMENT/PLAN:  Morbid obesity (HCC) Assessment & Plan: Patient desires weight loss and has expressed interest in weight loss injections. However, insurance coverage is uncertain and patient has already lost weight through dietary changes. Discussed the importance of learning to eat healthily and portion control. Patient is not diabetic and has no significant appetite. -Provided information for [facility] and [facility] MD for weight loss support. -Consider Metformin for insulin resistance if confirmed.   Mood disorder (HCC) Assessment & Plan: High scores on depression and anxiety scales. Patient reports stress from school and grief from the loss of a brother. No current medication for mood disorders. -Start Lexapro 5mg  daily for depression and anxiety. -Message provider in one week to assess response to medication.  Orders: -     Escitalopram Oxalate; Take 1 tablet (5 mg total) by mouth daily.  Dispense: 30 tablet; Refill: 0  Vitamin D deficiency Assessment & Plan: Patient reports fatigue and has a history of low vitamin D levels. Unclear if patient is currently taking prescribed high-dose vitamin D. -Check patient's vitamin D levels at next visit -Continue high-dose vitamin D was previously prescribed   Orders: -     Cholecalciferol; Take 1 (one) capsule by mouth once a week for 8 weeks.  Dispense: 8 capsule; Refill: 0   Follow-up in 2 weeks, can be via video visit if more convenient for the patient.    PDMP reviewed  Return in about 2 weeks (around 08/20/2023) for PCP.  Dana Allan, MD

## 2023-08-07 ENCOUNTER — Encounter: Payer: Self-pay | Admitting: Family Medicine

## 2023-08-07 NOTE — Assessment & Plan Note (Signed)
Patient reports fatigue and has a history of low vitamin D levels. Unclear if patient is currently taking prescribed high-dose vitamin D. -Check patient's vitamin D levels at next visit -Continue high-dose vitamin D was previously prescribed

## 2023-08-07 NOTE — Assessment & Plan Note (Signed)
High scores on depression and anxiety scales. Patient reports stress from school and grief from the loss of a brother. No current medication for mood disorders. -Start Lexapro 5mg  daily for depression and anxiety. -Message provider in one week to assess response to medication.

## 2023-08-07 NOTE — Assessment & Plan Note (Signed)
Patient desires weight loss and has expressed interest in weight loss injections. However, insurance coverage is uncertain and patient has already lost weight through dietary changes. Discussed the importance of learning to eat healthily and portion control. Patient is not diabetic and has no significant appetite. -Provided information for [facility] and [facility] MD for weight loss support. -Consider Metformin for insulin resistance if confirmed.

## 2023-08-20 ENCOUNTER — Other Ambulatory Visit: Payer: Self-pay

## 2023-08-20 ENCOUNTER — Ambulatory Visit (INDEPENDENT_AMBULATORY_CARE_PROVIDER_SITE_OTHER): Payer: 59 | Admitting: Family Medicine

## 2023-08-20 VITALS — BP 122/72 | HR 86 | Temp 98.2°F | Resp 16 | Ht 65.0 in | Wt 240.0 lb

## 2023-08-20 DIAGNOSIS — W19XXXA Unspecified fall, initial encounter: Secondary | ICD-10-CM

## 2023-08-20 DIAGNOSIS — Y92009 Unspecified place in unspecified non-institutional (private) residence as the place of occurrence of the external cause: Secondary | ICD-10-CM | POA: Diagnosis not present

## 2023-08-20 DIAGNOSIS — F39 Unspecified mood [affective] disorder: Secondary | ICD-10-CM | POA: Diagnosis not present

## 2023-08-20 MED ORDER — ESCITALOPRAM OXALATE 5 MG PO TABS: 5.0000 mg | ORAL_TABLET | Freq: Every day | ORAL | 1 refills | Status: DC

## 2023-08-20 NOTE — Patient Instructions (Addendum)
It was a pleasure meeting you today. Thank you for allowing me to take part in your health care.  Our goals for today as we discussed include:  Glad you are feeling a little better  Continue Lexapro 5 mg daily  Follow up in 3 months  Good luck on exam today !!!  Avoid Ibuprofen or any NSAID containing products.  Hematoma will take some time to self resolve   This is a list of the screening recommended for you and due dates:  Health Maintenance  Topic Date Due   COVID-19 Vaccine (3 - Pfizer risk series) 03/18/2020   Flu Shot  01/13/2024*   Pap with HPV screening  05/02/2024   DTaP/Tdap/Td vaccine (2 - Td or Tdap) 10/06/2031   Colon Cancer Screening  01/09/2033   Hepatitis C Screening  Completed   HIV Screening  Completed   HPV Vaccine  Aged Out  *Topic was postponed. The date shown is not the original due date.       If you have any questions or concerns, please do not hesitate to call the office at [phone].  I look forward to our next visit and until then take care and stay safe.  Regards,   Dana Allan, MD   [facility]

## 2023-08-20 NOTE — Progress Notes (Signed)
SUBJECTIVE:   Chief Complaint  Patient presents with   Medication Consultation    Pt started on Lexapro   HPI Presents to clinic for acute visit  Discussed the use of AI scribe software for clinical note transcription with the patient, who gave verbal consent to proceed.  History of Present Illness The patient, who has been on Lexapro for two weeks, reports a slight improvement in mood and irritability. They note that minor irritations no longer bother them as much, but they still experience some irritability. They have not noticed any significant side effects from the medication and have decided to continue with the current dosage of 5mg .  In addition to their mental health, the patient experienced a fall down the stairs at home about a week ago, resulting in a large hematoma on their leg. The fall occurred late at night while they were studying and going to do laundry. They slipped on the carpeted stairs while barefoot. They did not lose consciousness and remember the fall clearly. The hematoma has been painful and has not shown significant improvement since the incident. They have been managing the pain with ice.    PERTINENT PMH / PSH: As above  OBJECTIVE:  BP 122/72   Pulse 86   Temp 98.2 F (36.8 C)   Resp 16   Ht 5\' 5"  (1.651 m)   Wt 240 lb (108.9 kg)   LMP 07/29/2023 (Approximate)   SpO2 98%   BMI 39.94 kg/m    Physical Exam Vitals reviewed.  Constitutional:      General: She is not in acute distress.    Appearance: Normal appearance. She is obese. She is not ill-appearing, toxic-appearing or diaphoretic.  Eyes:     General:        Right eye: No discharge.        Left eye: No discharge.     Conjunctiva/sclera: Conjunctivae normal.  Cardiovascular:     Rate and Rhythm: Normal rate.  Pulmonary:     Effort: Pulmonary effort is normal.  Musculoskeletal:        General: Normal range of motion.  Skin:    General: Skin is warm and dry.  Neurological:      General: No focal deficit present.     Mental Status: She is alert and oriented to person, place, and time. Mental status is at baseline.  Psychiatric:        Mood and Affect: Mood normal.        Behavior: Behavior normal.        Thought Content: Thought content normal.        Judgment: Judgment normal.        08/20/2023    9:50 AM 08/06/2023    8:20 AM 07/04/2023    1:43 PM 02/04/2023   11:08 AM 01/17/2023   10:42 AM  Depression screen PHQ 2/9  Decreased Interest 3 3 3 3  0  Down, Depressed, Hopeless 2 3 2 1  0  PHQ - 2 Score 5 6 5 4  0  Altered sleeping 2 3 2 1  0  Tired, decreased energy 3 3 3 3  0  Change in appetite 3 3 2 3  0  Feeling bad or failure about yourself  2 1 0 3 0  Trouble concentrating 2 3 1  0 0  Moving slowly or fidgety/restless 0 1 0 0 0  Suicidal thoughts 0 0 0 0 0  PHQ-9 Score 17 20 13 14  0  Difficult doing work/chores Somewhat difficult Very  difficult Somewhat difficult Somewhat difficult Not difficult at all      08/20/2023    9:50 AM 08/06/2023    8:21 AM 07/04/2023    1:43 PM 02/04/2023   11:08 AM  GAD 7 : Generalized Anxiety Score  Nervous, Anxious, on Edge 3 3 1 3   Control/stop worrying 2 3 0 3  Worry too much - different things 2 3 1 3   Trouble relaxing 2 3 2 3   Restless 1 3 0 0  Easily annoyed or irritable 3 3 2 3   Afraid - awful might happen 0 2 1 2   Total GAD 7 Score 13 20 7 17   Anxiety Difficulty Somewhat difficult Very difficult Somewhat difficult Somewhat difficult    ASSESSMENT/PLAN:  Mood disorder (HCC) Assessment & Plan: On Lexapro 5mg  daily for 2 weeks. Reports slight improvement in mood and irritability. No adverse effects noted. -Continue Lexapro 5mg  daily. -Consider increasing dose if no significant improvement in symptoms. -Schedule follow-up in 3 months or sooner if dose adjustment is desired.  Orders: -     Escitalopram Oxalate; Take 1 tablet (5 mg total) by mouth daily.  Dispense: 90 tablet; Refill: 1  Fall in home, initial  encounter Assessment & Plan: Larey Seat down stairs on 08/18/2023, resulting in a large hematoma on the left posterior upper thigh. No loss of consciousness or other injuries reported. Hematoma resolving. -Continue home care with ice. -Monitor for changes in size, color, or pain level. -Seek medical attention if condition worsens.     PDMP reviewed  Return in about 3 months (around 11/20/2023) for RN clinic.  Dana Allan, MD

## 2023-08-24 ENCOUNTER — Encounter: Payer: Self-pay | Admitting: Family Medicine

## 2023-08-24 DIAGNOSIS — W19XXXA Unspecified fall, initial encounter: Secondary | ICD-10-CM | POA: Insufficient documentation

## 2023-08-24 DIAGNOSIS — Y92009 Unspecified place in unspecified non-institutional (private) residence as the place of occurrence of the external cause: Secondary | ICD-10-CM | POA: Insufficient documentation

## 2023-08-24 NOTE — Assessment & Plan Note (Signed)
Fell down stairs on 08/18/2023, resulting in a large hematoma on the left posterior upper thigh. No loss of consciousness or other injuries reported. Hematoma resolving. -Continue home care with ice. -Monitor for changes in size, color, or pain level. -Seek medical attention if condition worsens.

## 2023-08-24 NOTE — Assessment & Plan Note (Signed)
On Lexapro 5mg  daily for 2 weeks. Reports slight improvement in mood and irritability. No adverse effects noted. -Continue Lexapro 5mg  daily. -Consider increasing dose if no significant improvement in symptoms. -Schedule follow-up in 3 months or sooner if dose adjustment is desired.

## 2023-09-16 ENCOUNTER — Emergency Department: Payer: 59

## 2023-09-16 ENCOUNTER — Encounter: Payer: Self-pay | Admitting: Emergency Medicine

## 2023-09-16 ENCOUNTER — Other Ambulatory Visit: Payer: Self-pay

## 2023-09-16 ENCOUNTER — Emergency Department
Admission: EM | Admit: 2023-09-16 | Discharge: 2023-09-16 | Disposition: A | Discharge status: Home / Self Care | Payer: 59 | Attending: Emergency Medicine | Admitting: Emergency Medicine

## 2023-09-16 DIAGNOSIS — S92512A Displaced fracture of proximal phalanx of left lesser toe(s), initial encounter for closed fracture: Secondary | ICD-10-CM | POA: Diagnosis not present

## 2023-09-16 DIAGNOSIS — S92415A Nondisplaced fracture of proximal phalanx of left great toe, initial encounter for closed fracture: Secondary | ICD-10-CM | POA: Diagnosis not present

## 2023-09-16 DIAGNOSIS — W108XXA Fall (on) (from) other stairs and steps, initial encounter: Secondary | ICD-10-CM | POA: Insufficient documentation

## 2023-09-16 DIAGNOSIS — J45909 Unspecified asthma, uncomplicated: Secondary | ICD-10-CM | POA: Diagnosis not present

## 2023-09-16 DIAGNOSIS — I10 Essential (primary) hypertension: Secondary | ICD-10-CM | POA: Diagnosis not present

## 2023-09-16 DIAGNOSIS — M79662 Pain in left lower leg: Secondary | ICD-10-CM | POA: Diagnosis not present

## 2023-09-16 DIAGNOSIS — S99922A Unspecified injury of left foot, initial encounter: Secondary | ICD-10-CM | POA: Diagnosis present

## 2023-09-16 MED ORDER — MELOXICAM 15 MG PO TABS
15.0000 mg | ORAL_TABLET | Freq: Every day | ORAL | 0 refills | Status: AC
  Filled 2023-09-16: qty 30, 30d supply, fill #0

## 2023-09-16 MED ORDER — HYDROCODONE-ACETAMINOPHEN 5-325 MG PO TABS
1.0000 | ORAL_TABLET | Freq: Four times a day (QID) | ORAL | 0 refills | Status: AC | PRN
  Filled 2023-09-16: qty 15, 4d supply, fill #0

## 2023-09-16 MED ORDER — HYDROCODONE-ACETAMINOPHEN 5-325 MG PO TABS
1.0000 | ORAL_TABLET | Freq: Once | ORAL | Status: AC
  Administered 2023-09-16: 1 via ORAL
  Filled 2023-09-16: qty 1

## 2023-09-16 NOTE — ED Provider Notes (Signed)
[facility] Provider Note    Event Date/Time   First MD Initiated Contact with Patient 09/16/23 [phone]     (approximate)   History   Fall   HPI  Cassandra Cardenas is a 47 y.o. female presents to the ED with complaint of left lower extremity injury after she fell down carpeted steps in her home this morning.  Patient denies any head injury or loss of consciousness.  She has continued to have left foot and mid tib-fib fib pain.  Patient has had difficulty ambulating since her fall.  Patient has history of hypertension, headache, GERD, asthma, anxiety.     Physical Exam   Triage Vital Signs: ED Triage Vitals  Encounter Vitals Group     BP 09/16/23 0921 139/89     Systolic BP Percentile --      Diastolic BP Percentile --      Pulse Rate 09/16/23 0921 90     Resp 09/16/23 0921 17     Temp 09/16/23 0922 98.4 F (36.9 C)     Temp Source 09/16/23 0922 Oral     SpO2 09/16/23 0921 97 %     Weight 09/16/23 0921 240 lb 1.3 oz (108.9 kg)     Height 09/16/23 0921 5\' 5"  (1.651 m)     Head Circumference --      Peak Flow --      Pain Score 09/16/23 0921 10     Pain Loc --      Pain Education --      Exclude from Growth Chart --     Most recent vital signs: Vitals:   09/16/23 0921 09/16/23 0922  BP: 139/89   Pulse: 90   Resp: 17   Temp:  98.4 F (36.9 C)  SpO2: 97%      General: Awake, no distress.  Alert, talkative, answers questions appropriately. CV:  Good peripheral perfusion.  Resp:  Normal effort.  Abd:  No distention.  Soft, nontender. Other:  Ecchymosis noted left great toe with moderate tenderness dorsal left foot, ankle and mid tib-fib.  There is some minimal tenderness on palpation of the anterior knee without effusion.  Nontender femur or hip to palpation or compression.  Nontender left ribs to palpation.   ED Results / Procedures / Treatments   Labs (all labs ordered are listed, but only abnormal results are displayed) Labs  Reviewed - No data to display    RADIOLOGY Left tib-fib x-ray images were reviewed and interpreted by myself independent of the radiologist and was negative for fracture or dislocation.  Left foot x-ray images were reviewed by myself independent of the radiologist and a comminuted fracture of the first digit proximal phalanx noted.  PROCEDURES:  Critical Care performed:   Procedures   MEDICATIONS ORDERED IN ED: Medications  HYDROcodone-acetaminophen (NORCO/VICODIN) 5-325 MG per tablet 1 tablet (1 tablet Oral Given 09/16/23 0959)     IMPRESSION / MDM / ASSESSMENT AND PLAN / ED COURSE  I reviewed the triage vital signs and the nursing notes.   Differential diagnosis includes, but is not limited to, contusion left lower extremity, fracture, dislocation, musculoskeletal strain secondary to fall.  47 year old female presents to the ED with complaint of left lower extremity pain after a fall that occurred in her home.  Patient denies any head injury or loss of consciousness but is does report that she landed with her left foot tucked under her.  X-ray images were reviewed and patient was made  aware that she does have a comminuted fracture of her great toe proximal phalanx.  Buddy tape was applied and patient was placed in a short cam walker.  She is instructed to ice and elevate and a prescription for pain medication and meloxicam was sent to the pharmacy.  Patient is to follow-up with podiatry on-call and information was listed on her discharge papers.      Patient's presentation is most consistent with acute complicated illness / injury requiring diagnostic workup.  FINAL CLINICAL IMPRESSION(S) / ED DIAGNOSES   Final diagnoses:  Closed nondisplaced fracture of proximal phalanx of left great toe, initial encounter     Rx / DC Orders   ED Discharge Orders          Ordered    HYDROcodone-acetaminophen (NORCO/VICODIN) 5-325 MG tablet  Every 6 hours PRN        09/16/23 1042     meloxicam (MOBIC) 15 MG tablet  Daily        09/16/23 1042             Note:  This document was prepared using Dragon voice recognition software and may include unintentional dictation errors.   Tommi Rumps, PA-C 09/16/23 1147    Jene Every, MD 09/16/23 [phone]

## 2023-09-16 NOTE — Discharge Instructions (Signed)
Call and make an appointment with podiatry.  The person on-call today is Dr. Annamary Rummage.  They also have an office in [place] for with [facility].  Tell them when you call that you were seen at [facility] and would prefer an appointment at the [facility] office if this is more convenient for you.  Ice and elevate your foot is needed for swelling of your great toe.  Wear the cam walker anytime you are up walking.  Do not have to wear it at night while sleeping.  You will be wearing the cam walker boot for approximately 4 weeks while your fracture heals.  A prescription for meloxicam and hydrocodone was sent to the pharmacy for you to take.  The meloxicam is for inflammation and the hydrocodone is for pain.  This medication could cause drowsiness so do not drive or operate machinery while taking the hydrocodone.

## 2023-09-16 NOTE — ED Triage Notes (Signed)
Pt here after falling down the stairs. Pt slipped and fell down a flight of stairs. Pt denies LOC or hitting her head. Pt c/o left leg and great toe pain.  Pt has not been able to bear weight on that leg.

## 2023-09-16 NOTE — ED Notes (Signed)
See triage note  Presents s/p fall  States she slipped down steps  States her left leg went behind her  Hitting her left great toe  Pain with swelling and to left great toe   also tender at lower leg and knee

## 2023-09-20 ENCOUNTER — Other Ambulatory Visit: Payer: Self-pay

## 2023-09-20 ENCOUNTER — Ambulatory Visit (INDEPENDENT_AMBULATORY_CARE_PROVIDER_SITE_OTHER): Payer: 59 | Admitting: Podiatry

## 2023-09-20 ENCOUNTER — Encounter: Payer: Self-pay | Admitting: Podiatry

## 2023-09-20 DIAGNOSIS — S92302A Fracture of unspecified metatarsal bone(s), left foot, initial encounter for closed fracture: Secondary | ICD-10-CM

## 2023-09-20 MED ORDER — GABAPENTIN 100 MG PO CAPS
100.0000 mg | ORAL_CAPSULE | Freq: Three times a day (TID) | ORAL | 1 refills | Status: DC
  Filled 2023-09-20: qty 90, 30d supply, fill #0

## 2023-09-20 NOTE — Progress Notes (Signed)
Chief Complaint  Patient presents with   Foot Pain    "I fell Monday and broke my toe in two places." N - broke toe L - hallux left D - Monday  O - suddenly C - sore, tender, sharp pain travels up leg, bruised, fractured A - walking,  T - wearing a boot, went to ER, xrays, Mobic but I can't take it, Vicodin [id number]    HPI: 47 y.o. female presenting today for outpatient follow-up after an injury that was sustained on 09/16/2023 when she fell on carpeted stairs at her home.  She went to the emergency department diagnosed with fracture of the proximal phalanx of the left great toe.  She is currently WBAT in the cam boot.  She does experience pain and shooting sensations along the lateral aspect of the leg up to the lateral aspect of the knee.  She describes as electrical sharp shooting.  Presenting for further treatment evaluation  Past Medical History:  Diagnosis Date   Abnormal Pap smear of cervix    lsil 01/06/13 no HPV testing done s/p cervical bx 01/15/13 negative +HPV/chronic cervicitis; pap 04/07/13 lsil HPV + f/u 09/17/13 negative; pap 01/14/14 Lsil HPV +; 08/26/14 negative transformation zone absent; pap 01/19/15 negative absent zone; pap 02/08/16 negative; pap 02/19/17 negative; pap 02/27/18 negative [facility]    Asthma    GERD (gastroesophageal reflux disease)    Headache    HTN (hypertension)    Hypokalemia 06/05/2019    Past Surgical History:  Procedure Laterality Date   COLONOSCOPY WITH PROPOFOL N/A 01/10/2023   Procedure: COLONOSCOPY WITH PROPOFOL;  Surgeon: Wyline Mood, MD;  Location: [facility] ENDOSCOPY;  Service: Gastroenterology;  Laterality: N/A;   KNEE SURGERY     left knee arthroscopy 47 y.o    TUBAL LIGATION     WISDOM TOOTH EXTRACTION      Allergies  Allergen Reactions   Latex Hives   Lexapro [Escitalopram]     Increased anger      Physical Exam: General: The patient is alert and oriented x3 in no acute distress.  Dermatology: Skin is warm, dry and supple bilateral  lower extremities.   Vascular: Palpable pedal pulses bilaterally. Capillary refill within normal limits.  Ecchymosis with edema noted along the medial aspect of the left foot  Neurological: Grossly intact via light touch.  Electrical sharp shooting sensation noted along the lateral aspect of the leg along the superficial peroneal nerve.  Musculoskeletal Exam: No pedal deformities noted.  Patient is able to dorsiflex and evert the foot  DG Foot Complete Left 09/16/2023 FINDINGS: There is an acute, slightly comminuted fracture involving the proximal aspect of the first proximal phalanx. Fracture fragments are in near anatomic alignment. No dislocation. No additional fracture or dislocations identified. IMPRESSION: Acute, slightly comminuted fracture involves the proximal aspect of the first proximal phalanx.  Assessment/Plan of Care: 1.  Closed nondisplaced fracture left hallux proximal phalanx; initial encounter 2.  Peroneal neuritis left leg  -Patient evaluated.  X-rays taken in the ED were reviewed -Continue minimal WBAT in the cam boot -RICE -I do believe the patient has some extent of peroneal injury however she is able to dorsiflex and evert and activate the peroneal muscles. -Prescription for gabapentin 100 mg TID #90 w/ 1 refill -No NSAIDs.  Patient states that it makes her high blood pressure elevated -Return to clinic 6 weeks follow-up x-ray       Felecia Shelling, DPM [facility]  Dr.  Felecia Shelling, DPM    [address] [place], [place] [address]                Office [phone]  Fax [phone]

## 2023-10-01 ENCOUNTER — Other Ambulatory Visit: Payer: Self-pay | Admitting: Medical Genetics

## 2023-10-02 ENCOUNTER — Other Ambulatory Visit
Admission: RE | Admit: 2023-10-02 | Discharge: 2023-10-02 | Disposition: A | Discharge status: Home / Self Care | Payer: Self-pay | Source: Ambulatory Visit | Attending: Medical Genetics | Admitting: Medical Genetics

## 2023-10-14 LAB — GENECONNECT MOLECULAR SCREEN: Genetic Analysis Overall Interpretation: NEGATIVE

## 2023-10-29 ENCOUNTER — Ambulatory Visit (INDEPENDENT_AMBULATORY_CARE_PROVIDER_SITE_OTHER): Payer: 59 | Admitting: Podiatry

## 2023-10-29 ENCOUNTER — Ambulatory Visit (INDEPENDENT_AMBULATORY_CARE_PROVIDER_SITE_OTHER): Payer: 59

## 2023-10-29 ENCOUNTER — Encounter: Payer: Self-pay | Admitting: Podiatry

## 2023-10-29 VITALS — Ht 65.0 in | Wt 240.0 lb

## 2023-10-29 DIAGNOSIS — S92302A Fracture of unspecified metatarsal bone(s), left foot, initial encounter for closed fracture: Secondary | ICD-10-CM

## 2023-10-29 NOTE — Progress Notes (Signed)
 Chief Complaint  Patient presents with   Fracture    Closed nondisplaced fracture left hallux proximal phalanx    HPI: 48 y.o. female presenting today for follow-up evaluation of a fracture to the left great toe.  Patient states that her left great toe does not hurt.  She discontinued the cam boot a few days ago and has been walking in tennis shoes without pain.  She continues to have some left knee pain however.   Brief history: Injury sustained on 09/16/2023 when she fell on carpeted stairs at her home.  She went to the emergency department diagnosed with fracture of the proximal phalanx of the left great toe.    Past Medical History:  Diagnosis Date   Abnormal Pap smear of cervix    lsil 01/06/13 no HPV testing done s/p cervical bx 01/15/13 negative +HPV/chronic cervicitis; pap 04/07/13 lsil HPV + f/u 09/17/13 negative; pap 01/14/14 Lsil HPV +; 08/26/14 negative transformation zone absent; pap 01/19/15 negative absent zone; pap 02/08/16 negative; pap 02/19/17 negative; pap 02/27/18 negative [facility]    Asthma    GERD (gastroesophageal reflux disease)    Headache    HTN (hypertension)    Hypokalemia 06/05/2019    Past Surgical History:  Procedure Laterality Date   COLONOSCOPY WITH PROPOFOL  N/A 01/10/2023   Procedure: COLONOSCOPY WITH PROPOFOL ;  Surgeon: Therisa Bi, MD;  Location: [facility] ENDOSCOPY;  Service: Gastroenterology;  Laterality: N/A;   KNEE SURGERY     left knee arthroscopy 48 y.o    TUBAL LIGATION     WISDOM TOOTH EXTRACTION      Allergies  Allergen Reactions   Latex Hives   Lexapro  [Escitalopram ]     Increased anger      Physical Exam: General: The patient is alert and oriented x3 in no acute distress.  Dermatology: Skin is warm, dry and supple bilateral lower extremities.   Vascular: Palpable pedal pulses bilaterally. Capillary refill within normal limits.  Ecchymosis with edema noted along the medial aspect of the left foot  Neurological: Grossly intact via light  touch.  Electrical sharp shooting sensation noted along the lateral aspect of the leg along the superficial peroneal nerve.  Musculoskeletal Exam: No pedal deformities noted.  Patient is able to dorsiflex and evert the foot.  She has no pain or tenderness with palpation to the great toe joint  DG Foot Complete Left 09/16/2023 FINDINGS: There is an acute, slightly comminuted fracture involving the proximal aspect of the first proximal phalanx. Fracture fragments are in near anatomic alignment. No dislocation. No additional fracture or dislocations identified. IMPRESSION: Acute, slightly comminuted fracture involves the proximal aspect of the first proximal phalanx.  Radiographic exam LT foot 10/29/2023 Routine healing noted to the fracture of the proximal phalanx left great toe.  Assessment/Plan of Care: 1.  Closed nondisplaced fracture left hallux proximal phalanx; subsequent encounter.  DOI: 09/16/2023 2.  Peroneal neuritis left leg; resolved 3.  Left knee patellar pain since the time of injury  -Patient evaluated.  X-rays reviewed today -The patient has discontinued the cam boot over the last few days.  She says that she feels well in tennis shoes.  She may continue however I cautioned the patient that her fracture is still healing -If she continues to have left knee pain recommend follow-up with orthopedics -No NSAIDs.  Patient states that it makes her high blood pressure elevated - Return to clinic as needed       Thresa EMERSON Sar, DPM [facility]  Dr. Thresa EMERSON Sar, DPM    [address] [place], [place] [address]                Office [phone]  Fax [phone]

## 2023-11-18 ENCOUNTER — Ambulatory Visit: Payer: 59

## 2023-11-18 VITALS — BP 121/83

## 2023-11-18 DIAGNOSIS — I10 Essential (primary) hypertension: Secondary | ICD-10-CM

## 2023-11-18 NOTE — Progress Notes (Signed)
Pt presented for BP check.   Pt denies: chest pain, sob  Stated has had a headache for days now.     BP after 5 minutes of rest:   BP: 121/83  BP WNL pt discharged.

## 2024-02-28 IMAGING — DX DG LUMBAR SPINE COMPLETE 4+V
5 series · 5 of 5 positions shown · non-contrast
Comparison: None Available.

CLINICAL DATA: Right-sided low back with bilateral leg pain.

EXAM:
LUMBAR SPINE - COMPLETE 4+ VIEW

[lumbar spine ap]
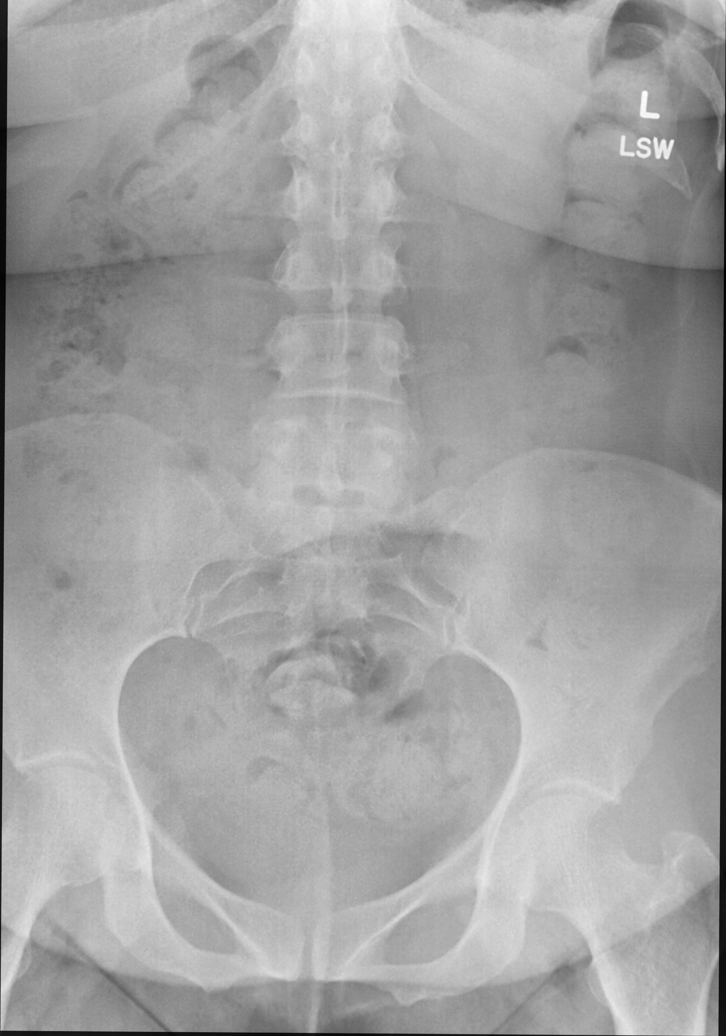

[lumbar spine obl (oblique) (1 of 2)]
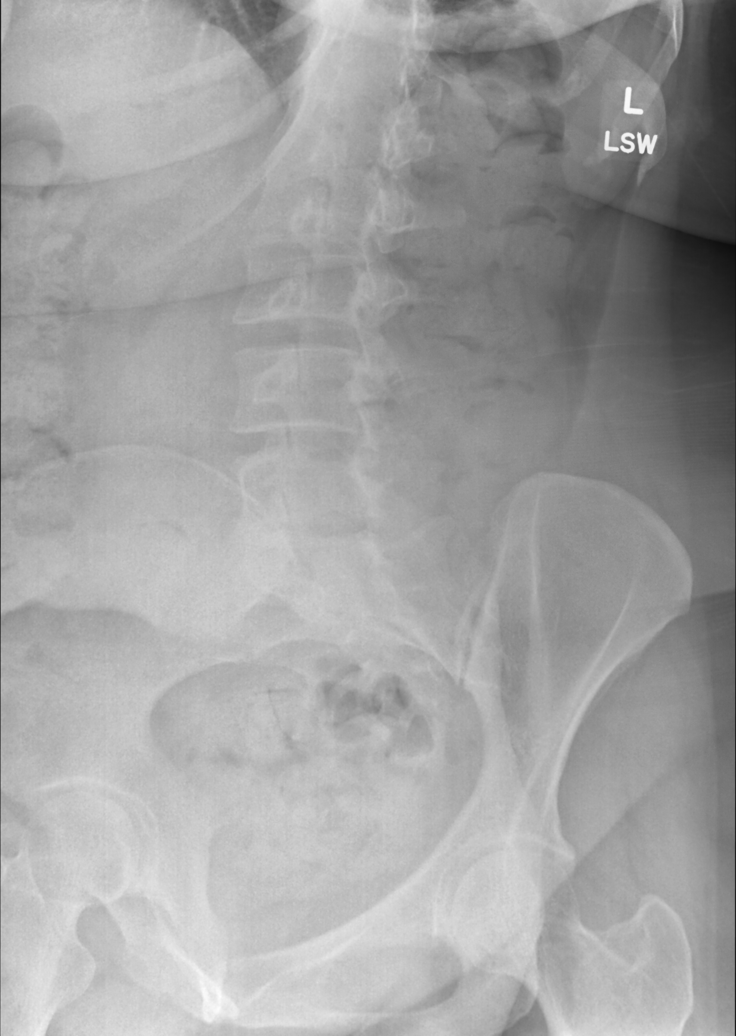

[lumbar spine obl (oblique) (2 of 2)]
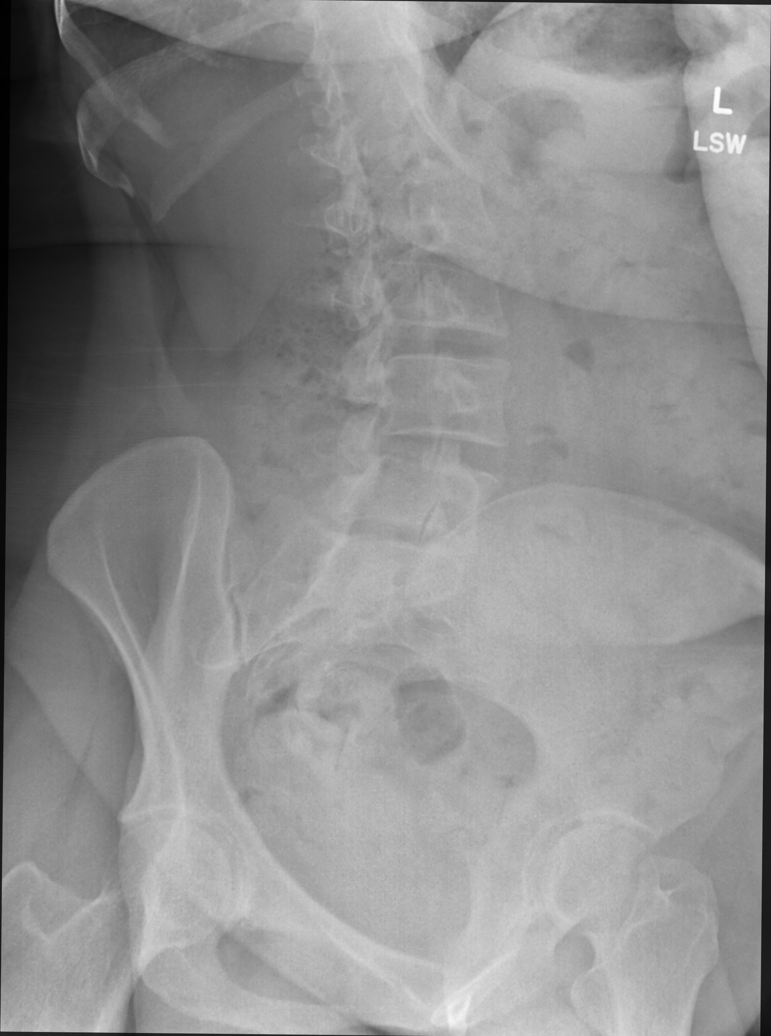

[lumbar spine lat]
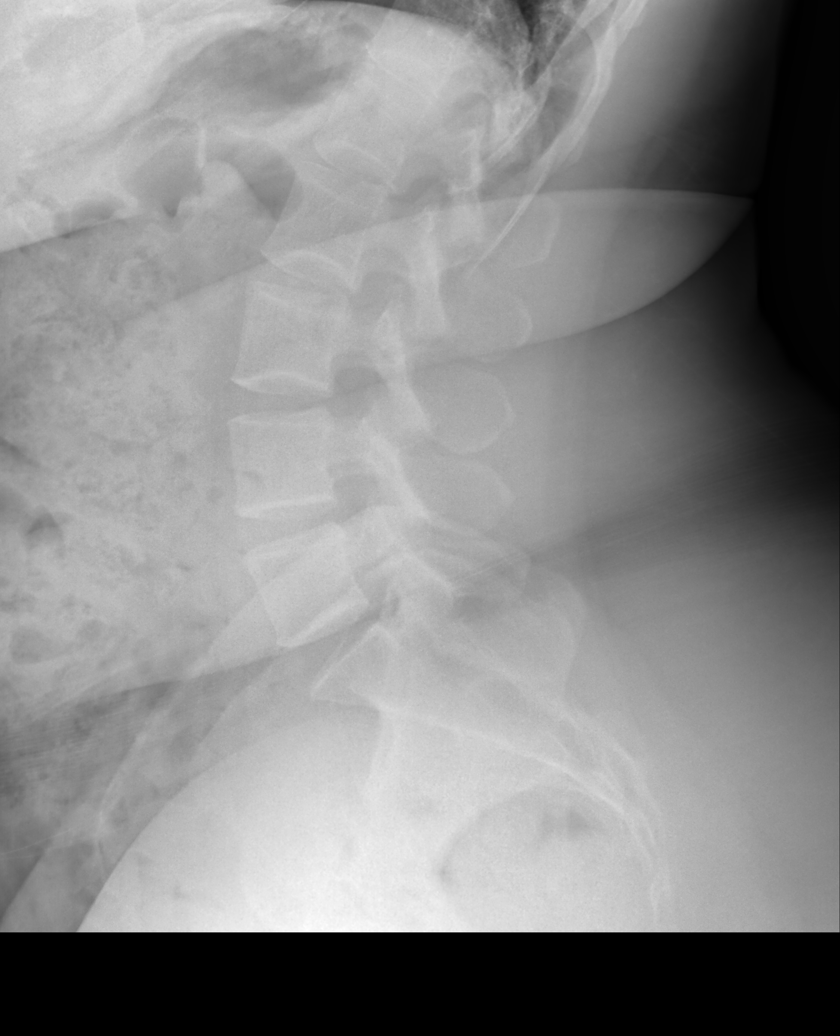

[lumbar spot lat]
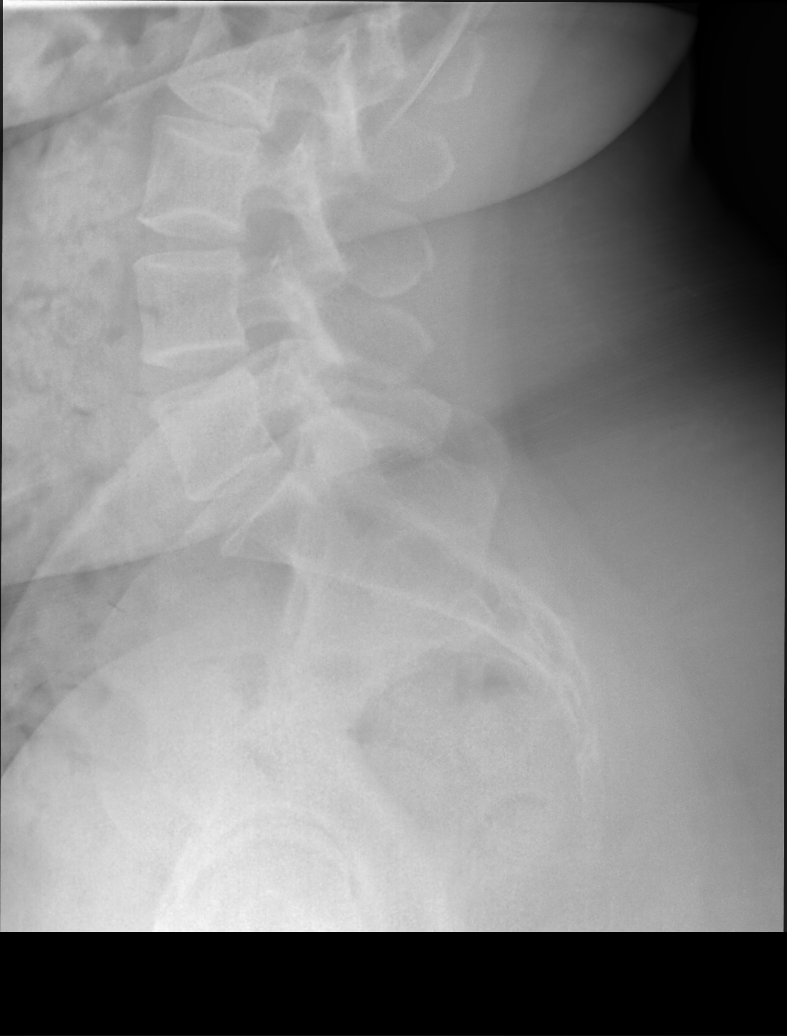

[5 of 5 positions shown; findings below may reference images not displayed]

FINDINGS: Lumbar alignment within normal limits. Vertebral body heights are
maintained. The disc spaces are patent.
IMPRESSION: Negative.

## 2024-05-12 ENCOUNTER — Telehealth: Payer: Self-pay | Admitting: *Deleted

## 2024-05-12 NOTE — Telephone Encounter (Signed)
 Needs TOC from [facility] to provider in office.

## 2024-11-19 ENCOUNTER — Other Ambulatory Visit: Payer: Self-pay

## 2024-11-19 ENCOUNTER — Encounter: Payer: Self-pay | Admitting: Family Medicine

## 2024-11-19 ENCOUNTER — Ambulatory Visit: Admitting: Family Medicine

## 2024-11-19 VITALS — BP 128/84 | HR 70 | Temp 97.7°F | Ht 65.0 in | Wt 244.5 lb

## 2024-11-19 DIAGNOSIS — J4520 Mild intermittent asthma, uncomplicated: Secondary | ICD-10-CM

## 2024-11-19 DIAGNOSIS — F321 Major depressive disorder, single episode, moderate: Secondary | ICD-10-CM | POA: Insufficient documentation

## 2024-11-19 DIAGNOSIS — E611 Iron deficiency: Secondary | ICD-10-CM

## 2024-11-19 DIAGNOSIS — E559 Vitamin D deficiency, unspecified: Secondary | ICD-10-CM

## 2024-11-19 DIAGNOSIS — R5383 Other fatigue: Secondary | ICD-10-CM

## 2024-11-19 DIAGNOSIS — R0683 Snoring: Secondary | ICD-10-CM

## 2024-11-19 DIAGNOSIS — G478 Other sleep disorders: Secondary | ICD-10-CM | POA: Insufficient documentation

## 2024-11-19 DIAGNOSIS — K219 Gastro-esophageal reflux disease without esophagitis: Secondary | ICD-10-CM | POA: Insufficient documentation

## 2024-11-19 DIAGNOSIS — I10 Essential (primary) hypertension: Secondary | ICD-10-CM

## 2024-11-19 DIAGNOSIS — G8929 Other chronic pain: Secondary | ICD-10-CM

## 2024-11-19 DIAGNOSIS — E538 Deficiency of other specified B group vitamins: Secondary | ICD-10-CM

## 2024-11-19 DIAGNOSIS — F4010 Social phobia, unspecified: Secondary | ICD-10-CM

## 2024-11-19 DIAGNOSIS — F419 Anxiety disorder, unspecified: Secondary | ICD-10-CM

## 2024-11-19 DIAGNOSIS — R87612 Low grade squamous intraepithelial lesion on cytologic smear of cervix (LGSIL): Secondary | ICD-10-CM

## 2024-11-19 DIAGNOSIS — J45909 Unspecified asthma, uncomplicated: Secondary | ICD-10-CM | POA: Insufficient documentation

## 2024-11-19 DIAGNOSIS — Z23 Encounter for immunization: Secondary | ICD-10-CM

## 2024-11-19 MED ORDER — ALBUTEROL SULFATE HFA 108 (90 BASE) MCG/ACT IN AERS
2.0000 | INHALATION_SPRAY | Freq: Four times a day (QID) | RESPIRATORY_TRACT | 11 refills | Status: AC | PRN
  Filled 2024-11-19: qty 18, 25d supply, fill #0
  Filled 2024-11-20: qty 6.7, 25d supply, fill #0

## 2024-11-19 MED ORDER — ESCITALOPRAM OXALATE 10 MG PO TABS
5.0000 mg | ORAL_TABLET | Freq: Every day | ORAL | 1 refills | Status: AC
  Filled 2024-11-19: qty 45, 90d supply, fill #0

## 2024-11-19 MED ORDER — AMLODIPINE BESYLATE 5 MG PO TABS
5.0000 mg | ORAL_TABLET | Freq: Every day | ORAL | 1 refills | Status: AC
  Filled 2024-11-19: qty 90, 90d supply, fill #0

## 2024-11-19 NOTE — Assessment & Plan Note (Signed)
 Chronic  Restart Lexapro  10mg  daily  Recommended establishing with therapist

## 2024-11-19 NOTE — Assessment & Plan Note (Signed)
 Chronic  BMI of 40. She expresses desire to lose weight and is considering pharmacological options. Concerns about potential side effects of weight loss medications, including tingling and blood clots, were discussed. Insurance coverage for weight loss medications was also discussed. - Discuss insurance coverage for [employer] or Zepbound. Pt will contact me once she knows if this is covered by her insurance  - Encouraged 200-240 minutes per week of moderate physical activity. - Encouraged dietary modifications, including cooking more meals at home.

## 2024-11-19 NOTE — Assessment & Plan Note (Signed)
 Chronic  Iron deficiency, which may contribute to fatigue. She reports not liking iron supplements. - Ordered CBC to assess for anemia.

## 2024-11-19 NOTE — Assessment & Plan Note (Signed)
 Chronic  Vitamin B12 deficiency. She is not currently taking B12 supplementation. - Ordered vitamin B12 level.

## 2024-11-19 NOTE — Assessment & Plan Note (Signed)
 Chronic  Recommended sleep study  Referral placed for home sleep study  Stop BANG score of 4, HR of OSA

## 2024-11-19 NOTE — Assessment & Plan Note (Signed)
 Chronic right knee pain, likely related to obesity. She reports difficulty with physical activity due to knee pain. - Encouraged weight loss to alleviate knee pain. - Encouraged physical activity as tolerated.

## 2024-11-19 NOTE — Patient Instructions (Addendum)
 Medication Options for Weight Loss   Ask insurance if the following medications are covered for weight loss   Wegovy (semaglutide) - pills or weekly injections  Tirzepatide (Zepbound) - weekly injections  Saxenda (daily injectionss)  Other Pill Options  - Contrave  - Metformin     To keep you healthy, please keep in mind the following health maintenance items that you are due for:   Health Maintenance Due  Topic Date Due   Pneumococcal Vaccine (1 of 2 - PCV) Never done   COVID-19 Vaccine (3 - Pfizer risk series) 03/18/2020   Cervical Cancer Screening (HPV/Pap Cotest)  05/02/2024         Contains text generated by [employer].    Best Wishes,   Dr. Lang

## 2024-11-19 NOTE — Assessment & Plan Note (Signed)
 Chronic  Vitamin D  deficiency. She is not currently taking vitamin D  supplementation. - Ordered vitamin D  level. - Prescribed vitamin D  supplementation.

## 2024-11-19 NOTE — Assessment & Plan Note (Signed)
 Chronic  Current blood pressure of 140/87 mmHg, improved to 128/84 on recheck. Previous use of amlodipine  10 mg resulted in ankle swelling. She is not currently taking antihypertensive medication. Discussed alternative medication options due to previous adverse effects. - Prescribed amlodipine  5 mg daily. - Monitor blood pressure regularly, especially after taking medication. - Will consider alternative antihypertensive medications if amlodipine  is not tolerated. -CMP and TSH ordered

## 2024-11-19 NOTE — Assessment & Plan Note (Addendum)
 Chronic  Previously diagnosed with bronchitis  Mild intermittent asthma. She uses albuterol  inhaler as needed, primarily triggered by smoke exposure and physical exertion. - Refilled albuterol  inhaler to use every 4 hours PRN

## 2024-11-19 NOTE — Assessment & Plan Note (Signed)
 Chronic  Depression and anxiety with a current PHQ-9 score of 12. Previous trial of Lexapro  5 mg was ineffective, but 10 mg was more beneficial. She expresses reluctance to take medication but is open to therapy. Previous therapy was not well-received due to personal discomfort with the process. - Prescribed Lexapro  10 mg daily. - Encouraged use of [email or web address] to find a therapist. - Discussed potential benefits of therapy for mental health.

## 2024-11-19 NOTE — Progress Notes (Signed)
 "  New Patient Office Visit  Patient ID: Cassandra Cardenas, Female   DOB: 08-Jun-1976 49 y.o. MRN: [id number]  Chief Complaint  Patient presents with   Establish Care    Patient is present to establish care with new PCP.  Diet exercise    Abdominal Pain    Patient reports abd pain possibly related to gas, states she thinks this could be from her collagen powder but is not sure. Lingering pain after she passes gas, located LUQ.    Fatigue    Patient reports that she is constantly fatigued and is concerned this may be something to do with weight, wanted to discuss weight loss.    Subjective:     Cassandra Cardenas presents to establish care  HPI  Discussed the use of AI scribe software for clinical note transcription with the patient, who gave verbal consent to proceed.  History of Present Illness Cassandra Cardenas is a 49 year old female with hypertension, anxiety, asthma, depression, GERD, and vitamin D  deficiency who presents with abdominal pain and fatigue.  She experiences acute abdominal pain, primarily in the left upper quadrant, which she suspects may be related to gas. The pain persists after passing gas. She has been taking collagen powder and believes it might be contributing to her symptoms, as her cousin experienced similar issues after starting the supplement.  She reports significant fatigue, describing herself as 'mentally tired' and expressing concern about her weight. Her fatigue has worsened over time, impacting her ability to perform daily activities such as washing dishes or grocery shopping without needing to rest. She attributes some of her fatigue to her weight, as she experiences shortness of breath when climbing stairs. She has a history of iron deficiency and suspects it may contribute to her fatigue.  She has chronic hypertension. She was prescribed amlodipine  2.5 mg but is not taking it due to side effects and forgetfulness. She also experiences  headaches when her blood pressure is high.  She has a history of anxiety and depression, for which she was prescribed Lexapro  5 mg daily, but she is not taking it. She previously found that a 10 mg dose was more effective in managing her symptoms. She describes her depression as linked to her weight, noting a decrease in self-esteem and motivation.  Her asthma is managed with an albuterol  inhaler, which she uses primarily when exposed to smoke or during physical exertion. She reports increased use of the inhaler recently due to triggers such as smoke and physical activity.  Her diet is described as poor, often eating out due to her son's sports activities. She is attempting to improve her diet by cooking at home more often and has acquired an air fryer and juicer to aid in this effort. She also reports poor sleep quality, often napping during the day and having difficulty falling asleep at night. She acknowledges snoring but denies waking up gasping for air.  Her family history includes hypertension in her mother. 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   Outpatient Encounter Medications as of 11/19/2024  Medication Sig   UNABLE TO FIND Med Name: Collagen powder   [DISCONTINUED] albuterol  (VENTOLIN  HFA) 108 (90 Base) MCG/ACT inhaler Inhale 2 puffs into the lungs every 6 (six) hours as needed for wheezing or shortness of breath.   albuterol  (VENTOLIN  HFA) 108 (90 Base) MCG/ACT inhaler Inhale 2 puffs into the lungs every  6 (six) hours as needed for wheezing or shortness of breath.   amLODipine  (NORVASC ) 5 MG tablet Take 1 tablet (5 mg total) by mouth at bedtime. If BP >130/>80   Cholecalciferol  1.25 MG (50000 UT) capsule Take 1 (one) capsule by mouth once a week for 8 weeks. (Patient not taking: Reported on 11/19/2024)   escitalopram  (LEXAPRO ) 10 MG tablet Take 0.5 tablets (5 mg total) by mouth daily.   levocetirizine (XYZAL ) 5 MG  tablet Take 1 tablet (5 mg total) by mouth at bedtime as needed for allergies. (Patient not taking: Reported on 11/19/2024)   [DISCONTINUED] amLODipine  (NORVASC ) 2.5 MG tablet Take 2 tablets (5 mg total) by mouth at bedtime. If BP >130/>80 (Patient not taking: Reported on 11/19/2024)   [DISCONTINUED] escitalopram  (LEXAPRO ) 5 MG tablet Take 1 tablet (5 mg total) by mouth daily. (Patient not taking: Reported on 11/19/2024)   [DISCONTINUED] escitalopram  (LEXAPRO ) 5 MG tablet Take 1 tablet (5 mg total) by mouth daily. (Patient not taking: Reported on 11/19/2024)   [DISCONTINUED] gabapentin  (NEURONTIN ) 100 MG capsule Take 1 capsule (100 mg total) by mouth 3 (three) times daily.   No facility-administered encounter medications on file as of 11/19/2024.    Past Medical History:  Diagnosis Date   Abnormal Pap smear of cervix    lsil 01/06/13 no HPV testing done s/p cervical bx 01/15/13 negative +HPV/chronic cervicitis; pap 04/07/13 lsil HPV + f/u 09/17/13 negative; pap 01/14/14 Lsil HPV +; 08/26/14 negative transformation zone absent; pap 01/19/15 negative absent zone; pap 02/08/16 negative; pap 02/19/17 negative; pap 02/27/18 negative [facility]    Allergy    Seasonal   Anemia    Anxiety    Social, testing   Arthritis    Old age and overweight   Asthma    Depression    When my brother passed 09/26/21   GERD (gastroesophageal reflux disease)    Headache    HTN (hypertension)    Hypokalemia 06/05/2019    Past Surgical History:  Procedure Laterality Date   COLONOSCOPY WITH PROPOFOL  N/A 01/10/2023   Procedure: COLONOSCOPY WITH PROPOFOL ;  Surgeon: Therisa Bi, MD;  Location: [facility] ENDOSCOPY;  Service: Gastroenterology;  Laterality: N/A;   KNEE SURGERY     left knee arthroscopy 49 y.o    TUBAL LIGATION     WISDOM TOOTH EXTRACTION      Family History  Problem Relation Age of Onset   Hypertension Mother    Cancer Father        ? type mets died in 37s   Multiple sclerosis Sister    Cancer Brother         pancreatitic age 43/age 3 12/2020 with mets on chemo as of 10/30/25 brother died   Breast cancer Neg Hx     Social History   Socioeconomic History   Marital status: Single    Spouse name: Not on file   Number of children: Not on file   Years of education: Not on file   Highest education level: Associate degree: occupational, scientist, product/process development, or vocational program  Occupational History   Not on file  Tobacco Use   Smoking status: Former    Current packs/day: 0.00    Types: Cigarettes    Quit date: 11/20/2003    Years since quitting: 21.0   Smokeless tobacco: Never  Vaping Use   Vaping status: Never Used  Substance and Sexual Activity   Alcohol use: Yes    Alcohol/week: 11.0 standard drinks of alcohol    Types:  10 Glasses of wine, 1 Shots of liquor per week    Comment: occ   Drug use: Never   Sexual activity: Not Currently    Birth control/protection: Abstinence  Other Topics Concern   Not on file  Social History Narrative   Works [employer] access    3 kids daughter 29 y.o 2 sons 86 y.o and 43 y.o as of 05/2019    -daughter lives outside of the home    1 cat at home   DPR mother Mckensi Redinger [phone]   Social Drivers of Health   Tobacco Use: Medium Risk (11/19/2024)   Patient History    Smoking Tobacco Use: Former    Smokeless Tobacco Use: Never    Passive Exposure: Not on Actuary Strain: Low Risk (11/18/2024)   Overall Financial Resource Strain (CARDIA)    Difficulty of Paying Living Expenses: Not very hard  Food Insecurity: No Food Insecurity (11/18/2024)   Epic    Worried About Running Out of Food in the Last Year: Never true    Ran Out of Food in the Last Year: Never true  Transportation Needs: No Transportation Needs (11/18/2024)   Epic    Lack of Transportation (Medical): No    Lack of Transportation (Non-Medical): No  Physical Activity: Inactive (11/18/2024)   Exercise Vital Sign    Days of Exercise per Week: 0 days    Minutes of Exercise per Session:  Not on file  Stress: Stress Concern Present (11/18/2024)   [employer] of Occupational Health - Occupational Stress Questionnaire    Feeling of Stress: To some extent  Social Connections: Socially Isolated (11/18/2024)   Social Connection and Isolation Panel    Frequency of Communication with Friends and Family: Once a week    Frequency of Social Gatherings with Friends and Family: Once a week    Attends Religious Services: Never    Database Administrator or Organizations: No    Attends Engineer, Structural: Not on file    Marital Status: Never married  Intimate Partner Violence: Not on file  Depression (PHQ2-9): High Risk (11/19/2024)   Depression (PHQ2-9)    PHQ-2 Score: 12  Alcohol Screen: Low Risk (11/18/2024)   Alcohol Screen    Last Alcohol Screening Score (AUDIT): 4  Housing: Low Risk (11/18/2024)   Epic    Unable to Pay for Housing in the Last Year: No    Number of Times Moved in the Last Year: 0    Homeless in the Last Year: No  Utilities: Not on file  Health Literacy: Not on file    ROS    Objective:    BP 128/84 (BP Location: Right Arm, Patient Position: Sitting, Cuff Size: Normal)   Pulse 70   Temp 97.7 F (36.5 C) (Oral)   Ht 5' 5 (1.651 m)   Wt 244 lb 8 oz (110.9 kg)   LMP 11/04/2024 (Exact Date)   SpO2 100%   BMI 40.69 kg/m  Neck Circumference: 15in   Physical Exam Vitals reviewed.  Constitutional:      General: She is not in acute distress.    Appearance: Normal appearance. She is not ill-appearing.  Cardiovascular:     Rate and Rhythm: Normal rate and regular rhythm.  Pulmonary:     Effort: Pulmonary effort is normal. No respiratory distress.     Breath sounds: No wheezing, rhonchi or rales.  Neurological:     Mental Status: She is alert  and oriented to person, place, and time.  Psychiatric:        Mood and Affect: Mood normal.        Behavior: Behavior normal.      Last CBC Lab Results  Component Value Date   WBC 7.6  06/24/2023   HGB 12.8 06/24/2023   HCT 38.8 06/24/2023   MCV 90.0 06/24/2023   MCH 29.7 06/24/2023   RDW 12.8 06/24/2023   PLT 295 06/24/2023   Last metabolic panel Lab Results  Component Value Date   GLUCOSE 109 (H) 06/24/2023   NA 136 06/24/2023   K 3.8 06/24/2023   CL 107 06/24/2023   CO2 21 (L) 06/24/2023   BUN 12 06/24/2023   CREATININE 0.78 06/24/2023   GFRNONAA >60 06/24/2023   CALCIUM 8.6 (L) 06/24/2023   PROT 7.1 06/24/2023   ALBUMIN 4.1 06/24/2023   BILITOT 0.5 06/24/2023   ALKPHOS 52 06/24/2023   AST 18 06/24/2023   ALT 13 06/24/2023   ANIONGAP 8 06/24/2023   Last lipids Lab Results  Component Value Date   CHOL 193 03/22/2023   HDL 83 03/22/2023   LDLCALC 98 03/22/2023   TRIG 62 03/22/2023   CHOLHDL 2.3 03/22/2023   Last hemoglobin A1c Lab Results  Component Value Date   HGBA1C 5.2 03/22/2023   Last thyroid  functions Lab Results  Component Value Date   TSH 4.256 03/22/2023   Last vitamin D  Lab Results  Component Value Date   VD25OH 32.42 03/22/2023   Last vitamin B12 and Folate Lab Results  Component Value Date   VITAMINB12 187 03/22/2023   FOLATE 7.6 10/03/2022        Assessment & Plan:   Problem List Items Addressed This Visit     Anxiety   Chronic  Restart Lexapro  10mg  daily  Recommended establishing with therapist       Relevant Medications   escitalopram  (LEXAPRO ) 10 MG tablet   Asthma   Chronic  Previously diagnosed with bronchitis  Mild intermittent asthma. She uses albuterol  inhaler as needed, primarily triggered by smoke exposure and physical exertion. - Refilled albuterol  inhaler to use every 4 hours PRN       Relevant Medications   albuterol  (VENTOLIN  HFA) 108 (90 Base) MCG/ACT inhaler   B12 deficiency   Chronic  Vitamin B12 deficiency. She is not currently taking B12 supplementation. - Ordered vitamin B12 level.      Relevant Orders   Vitamin B12   Chronic pain of right knee   Chronic right knee pain,  likely related to obesity. She reports difficulty with physical activity due to knee pain. - Encouraged weight loss to alleviate knee pain. - Encouraged physical activity as tolerated.      Depression, major, single episode, moderate (HCC)   Chronic  Depression and anxiety with a current PHQ-9 score of 12. Previous trial of Lexapro  5 mg was ineffective, but 10 mg was more beneficial. She expresses reluctance to take medication but is open to therapy. Previous therapy was not well-received due to personal discomfort with the process. - Prescribed Lexapro  10 mg daily. - Encouraged use of [email or web address] to find a therapist. - Discussed potential benefits of therapy for mental health.      Relevant Medications   escitalopram  (LEXAPRO ) 10 MG tablet   Essential hypertension - Primary   Chronic  Current blood pressure of 140/87 mmHg, improved to 128/84 on recheck. Previous use of amlodipine  10 mg resulted in ankle swelling. She is not currently taking  antihypertensive medication. Discussed alternative medication options due to previous adverse effects. - Prescribed amlodipine  5 mg daily. - Monitor blood pressure regularly, especially after taking medication. - Will consider alternative antihypertensive medications if amlodipine  is not tolerated. -CMP and TSH ordered       Relevant Medications   amLODipine  (NORVASC ) 5 MG tablet   Other Relevant Orders   CMP14+EGFR   TSH + free T4   Iron deficiency   Chronic  Iron deficiency, which may contribute to fatigue. She reports not liking iron supplements. - Ordered CBC to assess for anemia.      Morbid obesity (HCC)   Chronic  BMI of 40. She expresses desire to lose weight and is considering pharmacological options. Concerns about potential side effects of weight loss medications, including tingling and blood clots, were discussed. Insurance coverage for weight loss medications was also discussed. - Discuss insurance coverage for [employer] or  Zepbound. Pt will contact me once she knows if this is covered by her insurance  - Encouraged 200-240 minutes per week of moderate physical activity. - Encouraged dietary modifications, including cooking more meals at home.      Relevant Orders   Hemoglobin A1c   CMP14+EGFR   TSH + free T4   Lipid panel   Poor sleep pattern   Chronic  Recommended sleep study  Referral placed for home sleep study  Stop BANG score of 4, HR of OSA       Relevant Orders   Home sleep test   RESOLVED: Social anxiety disorder   Relevant Medications   escitalopram  (LEXAPRO ) 10 MG tablet   Vitamin D  deficiency   Chronic  Vitamin D  deficiency. She is not currently taking vitamin D  supplementation. - Ordered vitamin D  level. - Prescribed vitamin D  supplementation.      Relevant Orders   VITAMIN D  25 Hydroxy (Vit-D Deficiency, Fractures)   Other Visit Diagnoses       Immunization due       Relevant Orders   Pneumococcal conjugate vaccine 20-valent (Prevnar 20) (Completed)     Low energy       Relevant Orders   Hemoglobin A1c   TSH + free T4   Folate   Vitamin B12   CBC   Home sleep test     Low grade squamous intraepithelial lesion on cytologic smear of cervix (LGSIL)       Relevant Orders   Ambulatory referral to Gynecology     Snoring       Relevant Orders   Home sleep test       Assessment and Plan Assessment & Plan Class 3 obesity   Fatigue and poor sleep pattern, suspected sleep apnea Chronic  Fatigue and poor sleep pattern with suspicion of sleep apnea. Reports snoring and daytime fatigue. High risk for sleep apnea based on STOP-BANG score and neck circumference. - Ordered home sleep study. - Encouraged weight loss and lifestyle modifications to improve sleep quality.    Abdominal pain, likely related to dietary supplement use Abdominal pain likely related to collagen powder supplementation. - Advised discontinuation of collagen powder supplementation.  General health  maintenance - Ordered Pap smear. - Ordered CMP, A1c, TSH, T4, lipid panel, vitamin D  level, B12, folate, and CBC. - Administered pneumococcal 20 vaccine.    Return in about 3 months (around 02/16/2025) for HTN,fatigue .   Rockie Agent, MD [facility]     "

## 2024-11-20 ENCOUNTER — Other Ambulatory Visit: Payer: Self-pay

## 2024-11-20 LAB — LIPID PANEL
Chol/HDL Ratio: 2.1 ratio (ref 0.0–4.4)
Cholesterol, Total: 196 mg/dL (ref 100–199)
HDL: 93 mg/dL
LDL Chol Calc (NIH): 92 mg/dL (ref 0–99)
Triglycerides: 60 mg/dL (ref 0–149)
VLDL Cholesterol Cal: 11 mg/dL (ref 5–40)

## 2024-11-20 LAB — CMP14+EGFR
ALT: 14 [IU]/L (ref 0–32)
AST: 16 [IU]/L (ref 0–40)
Albumin: 4.3 g/dL (ref 3.9–4.9)
Alkaline Phosphatase: 63 [IU]/L (ref 41–116)
BUN/Creatinine Ratio: 9 (ref 9–23)
BUN: 8 mg/dL (ref 6–24)
Bilirubin Total: 0.4 mg/dL (ref 0.0–1.2)
CO2: 19 mmol/L — ABNORMAL LOW (ref 20–29)
Calcium: 8.8 mg/dL (ref 8.7–10.2)
Chloride: 104 mmol/L (ref 96–106)
Creatinine, Ser: 0.92 mg/dL (ref 0.57–1.00)
Globulin, Total: 2.4 g/dL (ref 1.5–4.5)
Glucose: 99 mg/dL (ref 70–99)
Potassium: 4.1 mmol/L (ref 3.5–5.2)
Sodium: 138 mmol/L (ref 134–144)
Total Protein: 6.7 g/dL (ref 6.0–8.5)
eGFR: 77 mL/min/{1.73_m2}

## 2024-11-20 LAB — VITAMIN D 25 HYDROXY (VIT D DEFICIENCY, FRACTURES): Vit D, 25-Hydroxy: 21.8 ng/mL — ABNORMAL LOW (ref 30.0–100.0)

## 2024-11-20 LAB — CBC
Hematocrit: 38.1 % (ref 34.0–46.6)
Hemoglobin: 12.8 g/dL (ref 11.1–15.9)
MCH: 30.7 pg (ref 26.6–33.0)
MCHC: 33.6 g/dL (ref 31.5–35.7)
MCV: 91 fL (ref 79–97)
Platelets: 290 10*3/uL (ref 150–450)
RBC: 4.17 x10E6/uL (ref 3.77–5.28)
RDW: 13 % (ref 11.7–15.4)
WBC: 8.2 10*3/uL (ref 3.4–10.8)

## 2024-11-20 LAB — HEMOGLOBIN A1C
Est. average glucose Bld gHb Est-mCnc: 105 mg/dL
Hgb A1c MFr Bld: 5.3 % (ref 4.8–5.6)

## 2024-11-20 LAB — TSH+FREE T4
Free T4: 1.2 ng/dL (ref 0.82–1.77)
TSH: 3.32 u[IU]/mL (ref 0.450–4.500)

## 2024-11-20 LAB — VITAMIN B12: Vitamin B-12: 257 pg/mL (ref 232–1245)

## 2024-11-20 LAB — FOLATE: Folate: 5.9 ng/mL

## 2025-02-24 ENCOUNTER — Ambulatory Visit: Admitting: Family Medicine
